# Patient Record
Sex: Female | Born: 1965
Health system: Southern US, Community
[De-identification: ages and names within clinical notes are randomized; demographics above are authoritative.]

## PROBLEM LIST (undated history)

## (undated) DIAGNOSIS — T7840XA Allergy, unspecified, initial encounter: Secondary | ICD-10-CM

## (undated) DIAGNOSIS — M270 Developmental disorders of jaws: Secondary | ICD-10-CM

## (undated) DIAGNOSIS — E785 Hyperlipidemia, unspecified: Secondary | ICD-10-CM

## (undated) DIAGNOSIS — B019 Varicella without complication: Secondary | ICD-10-CM

## (undated) DIAGNOSIS — R569 Unspecified convulsions: Secondary | ICD-10-CM

## (undated) DIAGNOSIS — H409 Unspecified glaucoma: Secondary | ICD-10-CM

## (undated) DIAGNOSIS — M1990 Unspecified osteoarthritis, unspecified site: Secondary | ICD-10-CM

## (undated) DIAGNOSIS — C801 Malignant (primary) neoplasm, unspecified: Secondary | ICD-10-CM

## (undated) HISTORY — DX: Developmental disorders of jaws: M27.0

## (undated) HISTORY — PX: REDUCTION MAMMAPLASTY: SUR839

## (undated) HISTORY — DX: Allergy, unspecified, initial encounter: T78.40XA

## (undated) HISTORY — DX: Varicella without complication: B01.9

## (undated) HISTORY — PX: CATARACT EXTRACTION: SUR2

## (undated) HISTORY — DX: Unspecified glaucoma: H40.9

## (undated) HISTORY — DX: Unspecified convulsions: R56.9

## (undated) HISTORY — DX: Unspecified osteoarthritis, unspecified site: M19.90

## (undated) HISTORY — DX: Hyperlipidemia, unspecified: E78.5

---

## 1989-11-10 HISTORY — PX: TUBAL LIGATION: SHX77

## 1994-11-10 HISTORY — PX: CARPAL TUNNEL RELEASE: SHX101

## 2000-11-10 HISTORY — PX: ABDOMINAL HYSTERECTOMY: SHX81

## 2007-10-04 ENCOUNTER — Ambulatory Visit: Payer: Self-pay | Admitting: Gynecology

## 2007-12-07 ENCOUNTER — Ambulatory Visit: Payer: Self-pay

## 2008-12-13 ENCOUNTER — Ambulatory Visit: Payer: Self-pay

## 2009-11-10 DIAGNOSIS — C801 Malignant (primary) neoplasm, unspecified: Secondary | ICD-10-CM

## 2009-11-10 HISTORY — DX: Malignant (primary) neoplasm, unspecified: C80.1

## 2009-12-18 ENCOUNTER — Ambulatory Visit: Payer: Self-pay

## 2010-12-24 ENCOUNTER — Ambulatory Visit: Payer: Self-pay

## 2011-03-25 NOTE — Assessment & Plan Note (Signed)
NAMEASIAH, BROWDER              ACCOUNT NO.:  [id number]   MEDICAL RECORD NO.:  [id number]          PATIENT TYPE:  POB   LOCATION:  [facility] at [facility]         FACILITY:  [facility]   PHYSICIAN:  [name], MD        DATE OF BIRTH:  29-Jul-1966   DATE OF SERVICE:  10/04/2007                                  CLINIC NOTE   HISTORY OF PRESENT ILLNESS:  Marcelino Duster is a 45 year old, married white  gravida 2, para 2 who comes in for her annual exam.  Her complaint this  year is that she has urinary frequency.  She does not have stress  incontinence.  She reports that she is having some insomnia issues, and  still some anxiety.  Of note, her mother died of pancreatic cancer last  year.  Her father has health issues.   MEDICATIONS:  1. Tylenol PM.  2. Tylenol as necessary.  3. Vitamin B12 p.o.  4. Vitamin E p.o.  5. Ginseng p.o.   PAST MEDICAL HISTORY:  1. History of gestational diabetes with both pregnancies.  2. Obesity.  3. Urinary frequency.  4. Arthritis of her hips.  5. Glaucoma.   REVIEW OF SYSTEMS:  She has been married for 7 years.  She denies  dyspareunia.  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   PAST SURGICAL HISTORY:  1. Vaginal hysterectomy.  2. Tubal ligation.  3. Right salpingo-oophorectomy.   ALLERGIES:  PENICILLIN.   FAMILY HISTORY:  Positive for pancreatic cancer in her mother.  Diabetes  in other family members.  She denies a family history of breast, GYN,  colon malignancies no latex allergy.   SOCIAL HISTORY:  She smokes.  She is working on quitting.  She is down  to half a pack a day.  She has been smoking for 20 years.  She reports  rare social alcohol.   PHYSICAL EXAMINATION:  VITAL SIGNS:  Weight 132 pounds, height 5 feet,  blood pressure 122/80.  HEENT:  Normal.  HEART:  Regular rate and rhythm.  LUNGS:  Clear to auscultation bilaterally.  BREAST:  Normal bilaterally.   ABDOMEN:  No masses, no hepatosplenomegaly.  No pain.  EXTERNAL  GENITALIA:  She has two skin areas of the right aspect of the  labia minora.  She says these have been there for years.  One appears to  be similar to a cholesterol deposit.  The other one is rather pimple-  like.  There are no other abnormalities.  Vaginal cuff appears normal.  Bimanual exam is negative.   ASSESSMENT/PLAN:  Annual exam.  Recommended she continue her self-breast  exam and start self vulvar exam monthly.  Due to her anxiety, I will  check a thyroid, and she will follow-up in several weeks to discuss  medical treatment for that.  With regard to her moderate obesity and her  history of gestational diabetes twice, will check a random glucose, and  general weight loss is advised.  She is already working on quitting  smoking.      [name], MD     MCD/MEDQ  D:  10/04/2007  T:  10/05/2007  Job:  [id number] 

## 2012-01-08 ENCOUNTER — Ambulatory Visit: Payer: Self-pay

## 2013-01-25 ENCOUNTER — Ambulatory Visit: Payer: Self-pay

## 2014-02-07 ENCOUNTER — Ambulatory Visit: Payer: Self-pay

## 2015-02-14 ENCOUNTER — Ambulatory Visit: Admit: 2015-02-14 | Disposition: A | Discharge status: Home / Self Care | Payer: Self-pay

## 2015-08-23 ENCOUNTER — Ambulatory Visit (INDEPENDENT_AMBULATORY_CARE_PROVIDER_SITE_OTHER): Payer: Managed Care, Other (non HMO) | Admitting: Primary Care

## 2015-08-23 ENCOUNTER — Encounter (INDEPENDENT_AMBULATORY_CARE_PROVIDER_SITE_OTHER): Payer: Self-pay

## 2015-08-23 ENCOUNTER — Encounter: Payer: Self-pay | Admitting: Primary Care

## 2015-08-23 VITALS — BP 122/84 | HR 68 | Temp 98.1°F | Ht 61.0 in | Wt 140.8 lb

## 2015-08-23 DIAGNOSIS — M1990 Unspecified osteoarthritis, unspecified site: Secondary | ICD-10-CM | POA: Diagnosis not present

## 2015-08-23 DIAGNOSIS — R030 Elevated blood-pressure reading, without diagnosis of hypertension: Secondary | ICD-10-CM

## 2015-08-23 DIAGNOSIS — I10 Essential (primary) hypertension: Secondary | ICD-10-CM | POA: Insufficient documentation

## 2015-08-23 DIAGNOSIS — H409 Unspecified glaucoma: Secondary | ICD-10-CM | POA: Diagnosis not present

## 2015-08-23 NOTE — Assessment & Plan Note (Signed)
Initially 142/84, 122/84 upon recheck. No prior history of elevated readings per patient. Will continue to monitor.

## 2015-08-23 NOTE — Assessment & Plan Note (Signed)
Diagnosed 10 years ago. Currently managed by glaucoma specialist semi-annually.
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
## 2015-08-23 NOTE — Assessment & Plan Note (Signed)
Present to hips and hands. Managed on 50 mg of Tramadol daily from prior PCP with relief. She typically takes 1/2 tablet. Ruled out RA at [facility].

## 2015-08-23 NOTE — Progress Notes (Signed)
Pre visit review using our clinic review tool, if applicable. No additional management support is needed unless otherwise documented below in the visit note. 

## 2015-08-23 NOTE — Patient Instructions (Signed)
Please schedule a physical with me in the next 3 months. You will also schedule a lab only appointment one week prior. We will discuss your lab results during your physical.  It was a pleasure to meet you today! Please don't hesitate to call me with any questions. Welcome to [place]!   

## 2015-09-06 ENCOUNTER — Other Ambulatory Visit: Payer: Self-pay | Admitting: *Deleted

## 2015-09-06 DIAGNOSIS — M1990 Unspecified osteoarthritis, unspecified site: Secondary | ICD-10-CM

## 2015-09-06 DIAGNOSIS — G4700 Insomnia, unspecified: Secondary | ICD-10-CM

## 2015-09-06 MED ORDER — TRAMADOL HCL 50 MG PO TABS: 25.0000 mg | ORAL_TABLET | Freq: Every day | ORAL | Status: DC | PRN

## 2015-09-06 MED ORDER — ZOLPIDEM TARTRATE 10 MG PO TABS: ORAL_TABLET | ORAL | Status: DC

## 2015-09-06 NOTE — Telephone Encounter (Signed)
Pt left voicemail at Triage. When pt established care with [name], NP pt forgot to request Rxs to be refilled, pt request meds refilled and then request call back once it's done  [facility]

## 2015-09-10 ENCOUNTER — Encounter: Payer: Self-pay | Admitting: Primary Care

## 2015-09-12 NOTE — Telephone Encounter (Signed)
Rx were called in on 09/06/2015. Not sure what happen. [facility] and spoken to [facility]ist. [facility]

## 2015-11-01 ENCOUNTER — Other Ambulatory Visit: Payer: Self-pay | Admitting: Primary Care

## 2015-11-01 NOTE — Telephone Encounter (Signed)
Electronically refill request for   tramadol (ULTRAM) 50 MG tablet   Take 0.5-1 tablets (25-50 mg total) by mouth daily as needed for severe pain.  Dispense: 30 tablet   Refills: 0     Last prescribed on 09/06/2015. Last seen on 08/23/2015. CPE on 11/29/2015.

## 2015-11-02 NOTE — Telephone Encounter (Signed)
[facility] 

## 2015-11-07 ENCOUNTER — Other Ambulatory Visit: Payer: Self-pay | Admitting: Primary Care

## 2015-11-08 NOTE — Telephone Encounter (Signed)
Rx called in as directed.   

## 2015-11-08 NOTE — Telephone Encounter (Signed)
Ok to refill 

## 2015-11-13 ENCOUNTER — Other Ambulatory Visit: Payer: Self-pay | Admitting: Primary Care

## 2015-11-13 DIAGNOSIS — Z0000 Encounter for general adult medical examination without abnormal findings: Secondary | ICD-10-CM

## 2015-11-22 ENCOUNTER — Other Ambulatory Visit: Payer: Managed Care, Other (non HMO)

## 2015-11-26 ENCOUNTER — Other Ambulatory Visit: Payer: Managed Care, Other (non HMO)

## 2015-11-29 ENCOUNTER — Encounter: Payer: Managed Care, Other (non HMO) | Admitting: Primary Care

## 2015-12-06 ENCOUNTER — Other Ambulatory Visit (INDEPENDENT_AMBULATORY_CARE_PROVIDER_SITE_OTHER): Payer: Managed Care, Other (non HMO)

## 2015-12-06 DIAGNOSIS — Z0000 Encounter for general adult medical examination without abnormal findings: Secondary | ICD-10-CM

## 2015-12-06 LAB — LIPID PANEL
CHOL/HDL RATIO: 4
Cholesterol: 154 mg/dL (ref 0–200)
HDL: 39 mg/dL — AB (ref >39.00)
LDL CALC: 85 mg/dL (ref 0–99)
NonHDL: 115.04
TRIGLYCERIDES: 148 mg/dL (ref 0.0–149.0)
VLDL: 29.6 mg/dL (ref 0.0–40.0)

## 2015-12-06 LAB — COMPREHENSIVE METABOLIC PANEL
ALT: 13 U/L (ref 0–35)
AST: 16 U/L (ref 0–37)
Albumin: 4.5 g/dL (ref 3.5–5.2)
Alkaline Phosphatase: 72 U/L (ref 39–117)
BUN: 21 mg/dL (ref 6–23)
CO2: 26 meq/L (ref 19–32)
Calcium: 9.2 mg/dL (ref 8.4–10.5)
Chloride: 104 mEq/L (ref 96–112)
Creatinine, Ser: 0.73 mg/dL (ref 0.40–1.20)
GFR: 90 mL/min (ref >60.00)
GLUCOSE: 95 mg/dL (ref 70–99)
POTASSIUM: 4.1 meq/L (ref 3.5–5.1)
Sodium: 137 mEq/L (ref 135–145)
Total Bilirubin: 0.4 mg/dL (ref 0.2–1.2)
Total Protein: 6.9 g/dL (ref 6.0–8.3)

## 2015-12-06 LAB — CBC
HEMATOCRIT: 41.8 % (ref 36.0–46.0)
HEMOGLOBIN: 14 g/dL (ref 12.0–15.0)
MCHC: 33.6 g/dL (ref 30.0–36.0)
MCV: 93.8 fl (ref 78.0–100.0)
Platelets: 403 10*3/uL — ABNORMAL HIGH (ref 150.0–400.0)
RBC: 4.46 Mil/uL (ref 3.87–5.11)
RDW: 13.2 % (ref 11.5–15.5)
WBC: 8.7 10*3/uL (ref 4.0–10.5)

## 2015-12-06 LAB — HEMOGLOBIN A1C: Hgb A1c MFr Bld: 6 % (ref 4.6–6.5)

## 2015-12-06 LAB — TSH: TSH: 0.7 u[IU]/mL (ref 0.35–4.50)

## 2015-12-06 LAB — VITAMIN D 25 HYDROXY (VIT D DEFICIENCY, FRACTURES): VITD: 26.81 ng/mL — ABNORMAL LOW (ref 30.00–100.00)

## 2015-12-13 ENCOUNTER — Encounter: Payer: Self-pay | Admitting: Radiology

## 2015-12-13 ENCOUNTER — Ambulatory Visit (INDEPENDENT_AMBULATORY_CARE_PROVIDER_SITE_OTHER): Payer: Managed Care, Other (non HMO) | Admitting: Primary Care

## 2015-12-13 ENCOUNTER — Encounter: Payer: Self-pay | Admitting: Primary Care

## 2015-12-13 VITALS — BP 132/84 | HR 76 | Temp 98.1°F | Ht 61.0 in | Wt 141.8 lb

## 2015-12-13 DIAGNOSIS — Z1239 Encounter for other screening for malignant neoplasm of breast: Secondary | ICD-10-CM | POA: Diagnosis not present

## 2015-12-13 DIAGNOSIS — Z0000 Encounter for general adult medical examination without abnormal findings: Secondary | ICD-10-CM | POA: Diagnosis not present

## 2015-12-13 DIAGNOSIS — E119 Type 2 diabetes mellitus without complications: Secondary | ICD-10-CM | POA: Insufficient documentation

## 2015-12-13 DIAGNOSIS — R7303 Prediabetes: Secondary | ICD-10-CM | POA: Diagnosis not present

## 2015-12-13 DIAGNOSIS — E559 Vitamin D deficiency, unspecified: Secondary | ICD-10-CM

## 2015-12-13 DIAGNOSIS — E1165 Type 2 diabetes mellitus with hyperglycemia: Secondary | ICD-10-CM | POA: Insufficient documentation

## 2015-12-13 DIAGNOSIS — Z0001 Encounter for general adult medical examination with abnormal findings: Secondary | ICD-10-CM | POA: Insufficient documentation

## 2015-12-13 DIAGNOSIS — M1990 Unspecified osteoarthritis, unspecified site: Secondary | ICD-10-CM

## 2015-12-13 DIAGNOSIS — R030 Elevated blood-pressure reading, without diagnosis of hypertension: Secondary | ICD-10-CM

## 2015-12-13 NOTE — Patient Instructions (Signed)
I've ordered your mammogram.  Continue your efforts towards a healthy lifestyle through healthy diet and exercise.  Reduce consumption of processed carbohydrates in the form of breads, rice, pastas.  Increase consumption of water. Try to consume 8 bottles of water daily.  You should be getting 1 hour of moderate intensity exercise 5 days weekly.  Start a vitamin D supplement. Take 2000 units of vitamin D daily. We will recheck your level in 3 months.  You are borderline diabetic. Continue to work on your diet and exercise for prevention. We will recheck these levels in 3 months.  Schedule a lab only appointment in 3 months.  Follow up in 1 year for repeat physical or sooner if needed.  It was a pleasure to see you today!

## 2015-12-13 NOTE — Assessment & Plan Note (Signed)
Level of 26. Will have her start 2000 units OTC daily. Will repeat levels in 3 months.

## 2015-12-13 NOTE — Assessment & Plan Note (Signed)
Stable today.  Will continue to monitor.

## 2015-12-13 NOTE — Progress Notes (Signed)
Subjective:    Patient ID: Monica Daniel, female    DOB: 1966-08-22, 50 y.o.   MRN: [id number]  HPI  Ms. Machado is a 50 year old female who presents today for complete physical.  Immunizations: -Tetanus: Completed within 10 years. -Influenza: Completed in October 2016   Diet: She endorses a healthy diet.  Breakfast: Cherrios, bagel Lunch: Sandwich (home made), apple, oatmeal cookie Dinner: Salad, meat, vegetable, whole wheat breads, pasta Snacks: cherrios, fiber bars Desserts: Occcasionally Beverages: Diet mountain dew, 1/2 sweet/unsweet tea, coffee, water ( 3 glasses daily)  Exercise: She has been exercising daily for the past several weeks. She will walk on the treadmill.  Eye exam: Completed late 2016 Dental exam: Completed in January 2017 Mammogram: Due in Spring 2017 Pap: Hysterectomy    Review of Systems  HENT: Negative for rhinorrhea.   Respiratory: Negative for cough and shortness of breath.   Cardiovascular: Negative for chest pain.  Gastrointestinal: Negative for diarrhea and constipation.  Genitourinary: Negative for difficulty urinating.  Musculoskeletal: Positive for arthralgias.       Taking 1/2 tramadol daily  Skin: Negative for rash.  Allergic/Immunologic: Positive for environmental allergies.  Neurological: Negative for dizziness, numbness and headaches.  Psychiatric/Behavioral:       Increased anxiety with husbands heart attack.        Past Medical History  Diagnosis Date  . Arthritis   . Chickenpox   . Allergy   . Seizures ([place])   . Glaucoma     Social History   Social History  . Marital Status: Married    Spouse Name: N/A  . Number of Children: N/A  . Years of Education: N/A   Occupational History  . Not on file.   Social History Main Topics  . Smoking status: Current Every Day Smoker -- 0.50 packs/day    Types: Cigarettes  . Smokeless tobacco: Not on file  . Alcohol Use: 0.0 oz/week    0 Standard drinks or equivalent per  week     Comment: rarely  . Drug Use: Not on file  . Sexual Activity: Not on file   Other Topics Concern  . Not on file   Social History Narrative   Married.   2 children.   Works doing Radiographer, therapeutic.   Enjoys going to [employer], fishing, gardening.    Past Surgical History  Procedure Laterality Date  . Abdominal hysterectomy  2002    Family History  Problem Relation Age of Onset  . Arthritis Father   . Lung cancer Father   . Hypertension Sister   . Pancreatic cancer Mother   . Diabetes Mother   . Diabetes Maternal Aunt     Allergies  Allergen Reactions  . Lac Bovis Nausea And Vomiting  . Penicillin G Rash    Current Outpatient Prescriptions on File Prior to Visit  Medication Sig Dispense Refill  . traMADol (ULTRAM) 50 MG tablet TAKE 1/2 TO 1 TABLET BY MOUTH EVERY DAY AS NEEDED FOR SEVERE PAIN 30 tablet 0  . zolpidem (AMBIEN) 10 MG tablet TAKE 1 TABLET BY MOUTH AT BEDTIME AS NEEDED FOR SLEEP 30 tablet 3   No current facility-administered medications on file prior to visit.    BP 132/84 mmHg  Pulse 76  Temp(Src) 98.1 F (36.7 C) (Oral)  Ht 5\' 1"  (1.549 m)  Wt 141 lb 12.8 oz (64.32 kg)  BMI 26.81 kg/m2  SpO2 98%    Objective:   Physical Exam  Constitutional:  She is oriented to person, place, and time. She appears well-nourished.  HENT:  Right Ear: Tympanic membrane and ear canal normal.  Left Ear: Tympanic membrane and ear canal normal.  Nose: Nose normal.  Mouth/Throat: Oropharynx is clear and moist.  Eyes: Conjunctivae and EOM are normal. Pupils are equal, round, and reactive to light.  Neck: Neck supple. No thyromegaly present.  Cardiovascular: Normal rate and regular rhythm.   No murmur heard. Pulmonary/Chest: Effort normal and breath sounds normal. She has no rales.  Abdominal: Soft. Bowel sounds are normal. There is no tenderness.  Musculoskeletal: Normal range of motion.  Lymphadenopathy:    She has no cervical adenopathy.    Neurological: She is alert and oriented to person, place, and time. She has normal reflexes. No cranial nerve deficit.  Skin: Skin is warm and dry. No rash noted.  Psychiatric: She has a normal mood and affect.          Assessment & Plan:

## 2015-12-13 NOTE — Assessment & Plan Note (Addendum)
Worse recently due to handing more outdoor house work activities as her husband was recently in the hospital. Managed on tramadol PRN. UDS due today.

## 2015-12-13 NOTE — Progress Notes (Signed)
Pre visit review using our clinic review tool, if applicable. No additional management support is needed unless otherwise documented below in the visit note. 

## 2015-12-13 NOTE — Assessment & Plan Note (Signed)
A1C of 6.0 on recent labs. Strong FH of diabetes. She has recently been working to improve her diet and start exercising. Education provided regarding diet and exercise. Will repeat labs in 3 months.

## 2015-12-13 NOTE — Assessment & Plan Note (Signed)
Tdap and Flu UTD. Ordered screening mammogram. Discussed the importance of a healthy diet and regular exercise in order for weight loss and to reduce risk of other medical diseases. Exam unremarkable. Labs with vitamin D def and borderline diabetes. Will repeat labs in 3 months. Follow up in 1 year for repeat physical.

## 2015-12-21 ENCOUNTER — Other Ambulatory Visit: Payer: Self-pay | Admitting: Primary Care

## 2015-12-21 NOTE — Telephone Encounter (Signed)
Sent. [facility]

## 2015-12-21 NOTE — Telephone Encounter (Signed)
Electronically refill request for   tramadol (ULTRAM) 50 MG tablet   TAKE 1/2 TO 1 TABLET BY MOUTH EVERY DAY AS NEEDED FOR SEVERE PAIN  Dispense: 30 tablet   Refills: 0     Last prescribed on 11/01/2015. Last seen on 12/13/2015. Lab appt on 03/13/2016.

## 2016-01-04 ENCOUNTER — Encounter: Payer: Self-pay | Admitting: Family Medicine

## 2016-01-20 ENCOUNTER — Other Ambulatory Visit: Payer: Self-pay | Admitting: Primary Care

## 2016-01-20 DIAGNOSIS — M1990 Unspecified osteoarthritis, unspecified site: Secondary | ICD-10-CM

## 2016-01-21 NOTE — Telephone Encounter (Signed)
Electronically refill request for   traMADol (ULTRAM) 50 MG tablet   TAKE 1/2 TO 1 TABLET BY MOUTH ONCE A DAY AS NEEDED FOR SEVERE PAIN  Dispense: 30 tablet   Refills: 0     Last prescribed on 12/21/2015. Last seen for CPE on 12/13/2015. Lab appt on 03/13/2016.

## 2016-01-22 NOTE — Telephone Encounter (Signed)
[facility] 

## 2016-01-29 ENCOUNTER — Encounter: Payer: Self-pay | Admitting: Primary Care

## 2016-03-05 ENCOUNTER — Other Ambulatory Visit: Payer: Self-pay | Admitting: Primary Care

## 2016-03-05 NOTE — Telephone Encounter (Signed)
Pt requesting a refill Tramadol HCL 50MG  TAKE 1/2 TO 1 TAB ONCE A DAY AS NEEDED FOR PAIN, last filled on 01/21/16 last OV 12/13/2015, 3 month lab appt scheduled as recommended.

## 2016-03-05 NOTE — Telephone Encounter (Signed)
Called and spoken patient. Notified patient that this is longer than a 3 month's supply. Patient verbalized understanding.

## 2016-03-05 NOTE — Telephone Encounter (Signed)
[facility] [address]

## 2016-03-06 ENCOUNTER — Other Ambulatory Visit: Payer: Self-pay | Admitting: Primary Care

## 2016-03-06 DIAGNOSIS — R7303 Prediabetes: Secondary | ICD-10-CM

## 2016-03-06 DIAGNOSIS — E559 Vitamin D deficiency, unspecified: Secondary | ICD-10-CM

## 2016-03-13 ENCOUNTER — Other Ambulatory Visit (INDEPENDENT_AMBULATORY_CARE_PROVIDER_SITE_OTHER): Payer: Managed Care, Other (non HMO)

## 2016-03-13 DIAGNOSIS — E559 Vitamin D deficiency, unspecified: Secondary | ICD-10-CM | POA: Diagnosis not present

## 2016-03-13 DIAGNOSIS — R7303 Prediabetes: Secondary | ICD-10-CM | POA: Diagnosis not present

## 2016-03-13 LAB — HEMOGLOBIN A1C: Hgb A1c MFr Bld: 6 % (ref 4.6–6.5)

## 2016-03-13 LAB — VITAMIN D 25 HYDROXY (VIT D DEFICIENCY, FRACTURES): VITD: 44.26 ng/mL (ref 30.00–100.00)

## 2016-03-24 ENCOUNTER — Ambulatory Visit
Admission: RE | Admit: 2016-03-24 | Discharge: 2016-03-24 | Disposition: A | Discharge status: Home / Self Care | Payer: Managed Care, Other (non HMO) | Source: Ambulatory Visit | Attending: Primary Care | Admitting: Primary Care

## 2016-03-24 DIAGNOSIS — Z1231 Encounter for screening mammogram for malignant neoplasm of breast: Secondary | ICD-10-CM | POA: Diagnosis not present

## 2016-03-24 DIAGNOSIS — Z1239 Encounter for other screening for malignant neoplasm of breast: Secondary | ICD-10-CM

## 2016-04-13 ENCOUNTER — Other Ambulatory Visit: Payer: Self-pay | Admitting: Primary Care

## 2016-04-13 DIAGNOSIS — G4700 Insomnia, unspecified: Secondary | ICD-10-CM

## 2016-04-14 NOTE — Telephone Encounter (Signed)
[facility]

## 2016-04-14 NOTE — Telephone Encounter (Signed)
Electronically refill request for   zolpidem (AMBIEN) 10 MG tablet   TAKE 1 TABLET BY MOUTH AT BEDTIME AS NEEDED FOR SLEEP  Dispense: 30 tablet   Refills: 3     Last prescribed on 11/08/2015. Last seen on 12/13/2015. No future appt.

## 2016-05-30 ENCOUNTER — Encounter: Payer: Self-pay | Admitting: Primary Care

## 2016-05-30 ENCOUNTER — Ambulatory Visit (INDEPENDENT_AMBULATORY_CARE_PROVIDER_SITE_OTHER): Payer: 59 | Admitting: Primary Care

## 2016-05-30 VITALS — BP 122/84 | HR 66 | Temp 98.0°F | Ht 61.0 in | Wt 135.1 lb

## 2016-05-30 DIAGNOSIS — J019 Acute sinusitis, unspecified: Secondary | ICD-10-CM

## 2016-05-30 MED ORDER — AZITHROMYCIN 250 MG PO TABS: ORAL_TABLET | ORAL | Status: DC

## 2016-05-30 NOTE — Patient Instructions (Signed)
Start Azithromycin antibiotics. Take 2 tablets by mouth today, then 1 tablet daily for 4 additional days.  Nasal Congestion: Try using Flonase (fluticasone) nasal spray. Instill 2 sprays in each nostril once daily.   Start Netipot rinses daily as discussed.  Ensure you are staying hydrated with water.  It was a pleasure to see you today!  Sinusitis, Adult Sinusitis is redness, soreness, and inflammation of the paranasal sinuses. Paranasal sinuses are air pockets within the bones of your face. They are located beneath your eyes, in the middle of your forehead, and above your eyes. In healthy paranasal sinuses, mucus is able to drain out, and air is able to circulate through them by way of your nose. However, when your paranasal sinuses are inflamed, mucus and air can become trapped. This can allow bacteria and other germs to grow and cause infection. Sinusitis can develop quickly and last only a short time (acute) or continue over a long period (chronic). Sinusitis that lasts for more than 12 weeks is considered chronic. CAUSES Causes of sinusitis include:  Allergies.  Structural abnormalities, such as displacement of the cartilage that separates your nostrils (deviated septum), which can decrease the air flow through your nose and sinuses and affect sinus drainage.  Functional abnormalities, such as when the small hairs (cilia) that line your sinuses and help remove mucus do not work properly or are not present. SIGNS AND SYMPTOMS Symptoms of acute and chronic sinusitis are the same. The primary symptoms are pain and pressure around the affected sinuses. Other symptoms include:  Upper toothache.  Earache.  Headache.  Bad breath.  Decreased sense of smell and taste.  A cough, which worsens when you are lying flat.  Fatigue.  Fever.  Thick drainage from your nose, which often is green and may contain pus (purulent).  Swelling and warmth over the affected  sinuses. DIAGNOSIS Your health care provider will perform a physical exam. During your exam, your health care provider may perform any of the following to help determine if you have acute sinusitis or chronic sinusitis:  Look in your nose for signs of abnormal growths in your nostrils (nasal polyps).  Tap over the affected sinus to check for signs of infection.  View the inside of your sinuses using an imaging device that has a light attached (endoscope). If your health care provider suspects that you have chronic sinusitis, one or more of the following tests may be recommended:  Allergy tests.  Nasal culture. A sample of mucus is taken from your nose, sent to a lab, and screened for bacteria.  Nasal cytology. A sample of mucus is taken from your nose and examined by your health care provider to determine if your sinusitis is related to an allergy. TREATMENT Most cases of acute sinusitis are related to a viral infection and will resolve on their own within 10 days. Sometimes, medicines are prescribed to help relieve symptoms of both acute and chronic sinusitis. These may include pain medicines, decongestants, nasal steroid sprays, or saline sprays. However, for sinusitis related to a bacterial infection, your health care provider will prescribe antibiotic medicines. These are medicines that will help kill the bacteria causing the infection. Rarely, sinusitis is caused by a fungal infection. In these cases, your health care provider will prescribe antifungal medicine. For some cases of chronic sinusitis, surgery is needed. Generally, these are cases in which sinusitis recurs more than 3 times per year, despite other treatments. HOME CARE INSTRUCTIONS  Drink plenty of water. Water  helps thin the mucus so your sinuses can drain more easily.  Use a humidifier.  Inhale steam 3-4 times a day (for example, sit in the bathroom with the shower running).  Apply a warm, moist washcloth to your face  3-4 times a day, or as directed by your health care provider.  Use saline nasal sprays to help moisten and clean your sinuses.  Take medicines only as directed by your health care provider.  If you were prescribed either an antibiotic or antifungal medicine, finish it all even if you start to feel better. SEEK IMMEDIATE MEDICAL CARE IF:  You have increasing pain or severe headaches.  You have nausea, vomiting, or drowsiness.  You have swelling around your face.  You have vision problems.  You have a stiff neck.  You have difficulty breathing.   This information is not intended to replace advice given to you by your health care provider. Make sure you discuss any questions you have with your health care provider.   Document Released: 10/27/2005 Document Revised: 11/17/2014 Document Reviewed: 11/11/2011 Elsevier Interactive Patient Education [employer].

## 2016-05-30 NOTE — Progress Notes (Signed)
Subjective:    Patient ID: Monica Daniel, female    DOB: [birth year], 50 y.o.   MRN: [id number]  HPI  [name] is a 50 year old female who presents today with a chief complaint of sinus pressure. She also reports nasal congestion and headaches. Her sinus pressure and congestion has been present for the past 2 weeks with worsening symptoms over the past several days. She's starting to blow out thick green mucous from her nasal cavity and notice increased fatigue. She's been using OTC sinus pills with temporary improvement. Overall she's feeling worse.  Review of Systems  Constitutional: Positive for chills and fatigue. Negative for fever.  HENT: Positive for congestion, postnasal drip and sinus pressure. Negative for sore throat.   Respiratory: Negative for cough and shortness of breath.   Cardiovascular: Negative for chest pain.       Past Medical History  Diagnosis Date  . Arthritis   . Chickenpox   . Allergy   . Seizures ([place])   . Glaucoma      Social History   Social History  . Marital Status: Married    Spouse Name: N/A  . Number of Children: N/A  . Years of Education: N/A   Occupational History  . Not on file.   Social History Main Topics  . Smoking status: Current Every Day Smoker -- 0.50 packs/day    Types: Cigarettes  . Smokeless tobacco: Not on file  . Alcohol Use: 0.0 oz/week    0 Standard drinks or equivalent per week     Comment: rarely  . Drug Use: Not on file  . Sexual Activity: Not on file   Other Topics Concern  . Not on file   Social History Narrative   Married.   2 children.   Works doing Radiographer, therapeutic.   Enjoys going to [employer], fishing, gardening.    Past Surgical History  Procedure Laterality Date  . Abdominal hysterectomy  2002  . Reduction mammaplasty Bilateral 20+yrs ago    Family History  Problem Relation Age of Onset  . Arthritis Father   . Lung cancer Father   . Hypertension Sister   . Pancreatic cancer  Mother   . Diabetes Mother   . Diabetes Maternal Aunt     Allergies  Allergen Reactions  . Lac Bovis Nausea And Vomiting  . Penicillin G Rash    Current Outpatient Prescriptions on File Prior to Visit  Medication Sig Dispense Refill  . zolpidem (AMBIEN) 10 MG tablet TAKE 1 TABLET BY MOUTH AT BEDTIME AS NEEDED FOR SLEEP 90 tablet 1  . traMADol (ULTRAM) 50 MG tablet TAKE 1/2 TO 1 TABLET BY MOUTH ONCE A DAY AS NEEDED FOR SEVERE PAIN (Patient not taking: Reported on 05/30/2016) 90 tablet 0   No current facility-administered medications on file prior to visit.    BP 122/84 mmHg  Pulse 66  Temp(Src) 98 F (36.7 C) (Oral)  Ht 5\' 1"  (1.549 m)  Wt 135 lb 1.9 oz (61.29 kg)  BMI 25.54 kg/m2  SpO2 98%    Objective:   Physical Exam  Constitutional: She appears well-nourished.  HENT:  Right Ear: Tympanic membrane and ear canal normal.  Left Ear: Tympanic membrane and ear canal normal.  Nose: Right sinus exhibits frontal sinus tenderness. Right sinus exhibits no maxillary sinus tenderness. Left sinus exhibits frontal sinus tenderness. Left sinus exhibits no maxillary sinus tenderness.  Mouth/Throat: Oropharynx is clear and moist.  Eyes: Conjunctivae are normal.  Neck: Neck supple.  Cardiovascular: Normal rate and regular rhythm.   Pulmonary/Chest: Effort normal and breath sounds normal. She has no wheezes. She has no rales.  Lymphadenopathy:    She has no cervical adenopathy.  Skin: Skin is warm and dry.          Assessment & Plan:  Acute Sinusitis:  Sinus pressure to frontal and maxillary sinuses with tenderness x 2 weeks. Overall feeling worse over past several days with thick, green mucous from nasal cavity, fatigue. Exam today with clear lungs, and overall unremarkable HENT exam. Given duration and worsening of symptoms will treat for presumed bacterial involvement. Rx for Zpak sent to pharmacy. Discussed use of Flonase and Netipot rinses.  Fluids, rest, follow up  PRN.

## 2016-05-30 NOTE — Progress Notes (Signed)
Pre visit review using our clinic review tool, if applicable. No additional management support is needed unless otherwise documented below in the visit note. 

## 2016-06-29 ENCOUNTER — Other Ambulatory Visit: Payer: Self-pay | Admitting: Primary Care

## 2016-06-29 DIAGNOSIS — M1990 Unspecified osteoarthritis, unspecified site: Secondary | ICD-10-CM

## 2016-06-30 NOTE — Telephone Encounter (Signed)
Ok to refill? Electronically refill request for   traMADol (ULTRAM) 50 MG tablet  Last prescribed on 03/05/2016. Last seen on 05/30/2016. No future appt.

## 2016-07-01 NOTE — Telephone Encounter (Signed)
Called in medication to the pharmacy as instructed. 

## 2016-10-28 ENCOUNTER — Encounter: Payer: Self-pay | Admitting: Radiology

## 2016-10-28 ENCOUNTER — Ambulatory Visit (INDEPENDENT_AMBULATORY_CARE_PROVIDER_SITE_OTHER): Payer: 59 | Admitting: Primary Care

## 2016-10-28 ENCOUNTER — Encounter: Payer: Self-pay | Admitting: Primary Care

## 2016-10-28 DIAGNOSIS — G4700 Insomnia, unspecified: Secondary | ICD-10-CM

## 2016-10-28 DIAGNOSIS — M1990 Unspecified osteoarthritis, unspecified site: Secondary | ICD-10-CM

## 2016-10-28 MED ORDER — TRAMADOL HCL 50 MG PO TABS
ORAL_TABLET | ORAL | 0 refills | Status: DC
Start: 2016-10-28 — End: 2017-02-18

## 2016-10-28 MED ORDER — ZOLPIDEM TARTRATE 10 MG PO TABS: 10.0000 mg | ORAL_TABLET | Freq: Every evening | ORAL | 1 refills | Status: DC | PRN

## 2016-10-28 NOTE — Progress Notes (Signed)
Subjective:    Patient ID: Monica Daniel, female    DOB: 1965-11-25, 50 y.o.   MRN: [id number]  HPI  Monica Daniel is a 50 year old female who presents today with a chief complaint of breast mass. Her mass is located to the right breast at the 9 o'clock position.  She completed a breast exam Wednesday night last week and noticed a pea sized knot to the right side. She has not noticed the knot since Sunday this past weekend. She denies pain, changes in skin texture, nipple discharge, family or personal history of breast cancer. She completed her mammogram in May 2017 which showed evidence of fibrocystic breasts.  Review of Systems  Constitutional: Negative for fatigue and unexpected weight change.  Cardiovascular: Negative for chest pain.  Genitourinary:       Breast mass  Skin: Negative for color change and wound.       Past Medical History:  Diagnosis Date  . Allergy   . Arthritis   . Chickenpox   . Glaucoma   . Seizures [facility])      Social History   Social History  . Marital status: Married    Spouse name: N/A  . Number of children: N/A  . Years of education: N/A   Occupational History  . Not on file.   Social History Main Topics  . Smoking status: Current Every Day Smoker    Packs/day: 0.50    Types: Cigarettes  . Smokeless tobacco: Not on file  . Alcohol use 0.0 oz/week     Comment: rarely  . Drug use: Unknown  . Sexual activity: Not on file   Other Topics Concern  . Not on file   Social History Narrative   Married.   2 children.   Works doing Radiographer, therapeutic.   Enjoys going to [employer], fishing, gardening.    Past Surgical History:  Procedure Laterality Date  . ABDOMINAL HYSTERECTOMY  2002  . REDUCTION MAMMAPLASTY Bilateral 20+yrs ago    Family History  Problem Relation Age of Onset  . Arthritis Father   . Lung cancer Father   . Hypertension Sister   . Pancreatic cancer Mother   . Diabetes Mother   . Diabetes Maternal Aunt      Allergies  Allergen Reactions  . Lac Bovis Nausea And Vomiting  . Penicillin G Rash    No current outpatient prescriptions on file prior to visit.   No current facility-administered medications on file prior to visit.     BP (!) 158/100   Pulse 65   Temp 97.8 F (36.6 C) (Oral)   Ht 5\' 1"  (1.549 m)   Wt 140 lb (63.5 kg)   SpO2 98%   BMI 26.45 kg/m    Objective:   Physical Exam  Constitutional: She appears well-nourished.  Neck: Neck supple.  Cardiovascular: Normal rate and regular rhythm.   Pulmonary/Chest: Effort normal and breath sounds normal. Right breast exhibits no mass, no nipple discharge, no skin change and no tenderness. Left breast exhibits no mass, no nipple discharge, no skin change and no tenderness.    Area of concern to right breast at 9 o'clock position. Mass felt to be dense breast tissue. No obvious cyst or suspicious mass. Left breast with dense breast tissue.  Skin: Skin is warm and dry.          Assessment & Plan:  Breast Mass:  Noticed Wednesday last week to right breast at 9 o'clock position.  Exam today with evidence of dense breast tissue bilatearlly. Area of concern not suspicious for infection, cyst, or suspicious mass. Mammogram normal in May 2017, also revealing evidence of fibrocystic dense tissue. Suspect this to be the case. Will have her continue to monitor and return/call if no improvement or changes occur. Strict return precautions provided.  Sheral Flow, NP

## 2016-10-28 NOTE — Progress Notes (Signed)
Pre visit review using our clinic review tool, if applicable. No additional management support is needed unless otherwise documented below in the visit note. 

## 2016-10-28 NOTE — Patient Instructions (Signed)
Your breast exam did not reveal anything suspicious.   Please notify me if you continue to notice the mass and/or if it grows in size, you notice changes in the texture of your breast.  It was a pleasure to see you today!   Breast Self-Awareness Introduction Breast self-awareness means being familiar with how your breasts look and feel. It involves checking your breasts regularly and reporting any changes to your health care provider. Practicing breast self-awareness is important. A change in your breasts can be a sign of a serious medical problem. Being familiar with how your breasts look and feel allows you to find any problems early, when treatment is more likely to be successful. All women should practice breast self-awareness, including women who have had breast implants. How to do a breast self-exam One way to learn what is normal for your breasts and whether your breasts are changing is to do a breast self-exam. To do a breast self-exam: Look for Changes  1. Remove all the clothing above your waist. 2. Stand in front of a mirror in a room with good lighting. 3. Put your hands on your hips. 4. Push your hands firmly downward. 5. Compare your breasts in the mirror. Look for differences between them (asymmetry), such as:  Differences in shape.  Differences in size.  Puckers, dips, and bumps in one breast and not the other. 6. Look at each breast for changes in your skin, such as:  Redness.  Scaly areas. 7. Look for changes in your nipples, such as:  Discharge.  Bleeding.  Dimpling.  Redness.  A change in position. Feel for Changes  Carefully feel your breasts for lumps and changes. It is best to do this while lying on your back on the floor and again while sitting or standing in the shower or tub with soapy water on your skin. Feel each breast in the following way:  Place the arm on the side of the breast you are examining above your head.  Feel your breast with the  other hand.  Start in the nipple area and make  inch (2 cm) overlapping circles to feel your breast. Use the pads of your three middle fingers to do this. Apply light pressure, then medium pressure, then firm pressure. The light pressure will allow you to feel the tissue closest to the skin. The medium pressure will allow you to feel the tissue that is a little deeper. The firm pressure will allow you to feel the tissue close to the ribs.  Continue the overlapping circles, moving downward over the breast until you feel your ribs below your breast.  Move one finger-width toward the center of the body. Continue to use the  inch (2 cm) overlapping circles to feel your breast as you move slowly up toward your collarbone.  Continue the up and down exam using all three pressures until you reach your armpit. Write Down What You Find  Write down what is normal for each breast and any changes that you find. Keep a written record with breast changes or normal findings for each breast. By writing this information down, you do not need to depend only on memory for size, tenderness, or location. Write down where you are in your menstrual cycle, if you are still menstruating. If you are having trouble noticing differences in your breasts, do not get discouraged. With time you will become more familiar with the variations in your breasts and more comfortable with the exam. How often  should I examine my breasts? Examine your breasts every month. If you are breastfeeding, the best time to examine your breasts is after a feeding or after using a breast pump. If you menstruate, the best time to examine your breasts is 5-7 days after your period is over. During your period, your breasts are lumpier, and it may be more difficult to notice changes. When should I see my health care provider? See your health care provider if you notice:  A change in shape or size of your breasts or nipples.  A change in the skin of  your breast or nipples, such as a reddened or scaly area.  Unusual discharge from your nipples.  A lump or thick area that was not there before.  Pain in your breasts.  Anything that concerns you. This information is not intended to replace advice given to you by your health care provider. Make sure you discuss any questions you have with your health care provider. Document Released: 10/27/2005 Document Revised: 04/03/2016 Document Reviewed: 09/16/2015  2017 Elsevier

## 2016-10-28 NOTE — Assessment & Plan Note (Signed)
Managed on Ambien for years. Refill provided today. UDS updated today.

## 2016-10-28 NOTE — Assessment & Plan Note (Signed)
Refill of Tramadol provided. UDS updated.

## 2016-10-29 ENCOUNTER — Encounter: Payer: Self-pay | Admitting: Primary Care

## 2016-12-22 ENCOUNTER — Encounter: Payer: Self-pay | Admitting: Family Medicine

## 2016-12-22 ENCOUNTER — Ambulatory Visit (INDEPENDENT_AMBULATORY_CARE_PROVIDER_SITE_OTHER): Payer: 59 | Admitting: Family Medicine

## 2016-12-22 DIAGNOSIS — B9789 Other viral agents as the cause of diseases classified elsewhere: Secondary | ICD-10-CM | POA: Diagnosis not present

## 2016-12-22 DIAGNOSIS — J069 Acute upper respiratory infection, unspecified: Secondary | ICD-10-CM | POA: Diagnosis not present

## 2016-12-22 NOTE — Progress Notes (Signed)
Pre visit review using our clinic review tool, if applicable. No additional management support is needed unless otherwise documented below in the visit note. 

## 2016-12-22 NOTE — Patient Instructions (Addendum)
Nice to meet you. Your symptoms are likely related to a virus.  You should start on Claritin or Zyrtec. You may also use Tylenol for any discomfort. You can use nasal saline rinses as well. If your symptoms are not improved by Friday please come back to see [place]. If you develop fevers, cough productive of blood, or any new or changing symptoms please seek medical attention immediately.

## 2016-12-22 NOTE — Assessment & Plan Note (Signed)
Symptoms most consistent with viral upper respiratory infection. Patient does not have any focal findings to indicate bacterial illness. Discussed supportive care. Continue Flonase. Can use Claritin or Zyrtec. Advised against nasal decongestants. Tylenol for any discomfort. Of note the patient reports she does not have glaucoma. If symptoms are not improving by Friday she will follow-up in the office for repeat evaluation.

## 2016-12-22 NOTE — Progress Notes (Signed)
  Tommi Rumps, MD Phone: [phone]  Monica Daniel is a 51 y.o. female who presents today for same-day visit.  Patient notes left ear congestion and left sinus congestion starting on Friday. Has developed frontal sinus congestion and right ear congestion as well. Blowing discolored bloody mucus out of her nose. No fevers. Minimal postnasal drip with minimal cough. States she does not feel terrible. Does have sick contacts at work. Uses Flonase regularly. Has been using a nasal decongestant as well.  ROS see history of present illness  Objective  Physical Exam Vitals:   12/22/16 1316  BP: (!) 142/90  Pulse: 79  Temp: 98.7 F (37.1 C)    BP Readings from Last 3 Encounters:  12/22/16 (!) 142/90  10/28/16 (!) 158/100  05/30/16 122/84   Wt Readings from Last 3 Encounters:  12/22/16 138 lb 6.4 oz (62.8 kg)  10/28/16 140 lb (63.5 kg)  05/30/16 135 lb 1.9 oz (61.3 kg)    Physical Exam  Constitutional: She is well-developed, well-nourished, and in no distress.  HENT:  Head: Normocephalic and atraumatic.  Mouth/Throat: Oropharynx is clear and moist. No oropharyngeal exudate.  Normal TMs and ear canals bilaterally  Eyes: Conjunctivae are normal. Pupils are equal, round, and reactive to light.  Cardiovascular: Normal rate, regular rhythm and normal heart sounds.   Pulmonary/Chest: Effort normal and breath sounds normal.     Assessment/Plan: Please see individual problem list.  Viral URI Symptoms most consistent with viral upper respiratory infection. Patient does not have any focal findings to indicate bacterial illness. Discussed supportive care. Continue Flonase. Can use Claritin or Zyrtec. Advised against nasal decongestants. Tylenol for any discomfort. Of note the patient reports she does not have glaucoma. If symptoms are not improving by Friday she will follow-up in the office for repeat evaluation.   Tommi Rumps, MD [place]

## 2017-01-23 ENCOUNTER — Other Ambulatory Visit: Payer: Self-pay | Admitting: Primary Care

## 2017-01-23 ENCOUNTER — Other Ambulatory Visit (INDEPENDENT_AMBULATORY_CARE_PROVIDER_SITE_OTHER): Payer: 59

## 2017-01-23 DIAGNOSIS — E559 Vitamin D deficiency, unspecified: Secondary | ICD-10-CM | POA: Diagnosis not present

## 2017-01-23 DIAGNOSIS — R7303 Prediabetes: Secondary | ICD-10-CM

## 2017-01-23 LAB — LIPID PANEL
CHOLESTEROL: 182 mg/dL (ref 0–200)
HDL: 44 mg/dL (ref >39.00)
LDL CALC: 115 mg/dL — AB (ref 0–99)
NonHDL: 137.76
TRIGLYCERIDES: 116 mg/dL (ref 0.0–149.0)
Total CHOL/HDL Ratio: 4
VLDL: 23.2 mg/dL (ref 0.0–40.0)

## 2017-01-23 LAB — COMPREHENSIVE METABOLIC PANEL
ALT: 14 U/L (ref 0–35)
AST: 15 U/L (ref 0–37)
Albumin: 4.7 g/dL (ref 3.5–5.2)
Alkaline Phosphatase: 81 U/L (ref 39–117)
BUN: 23 mg/dL (ref 6–23)
CO2: 27 meq/L (ref 19–32)
Calcium: 10.3 mg/dL (ref 8.4–10.5)
Chloride: 104 mEq/L (ref 96–112)
Creatinine, Ser: 0.77 mg/dL (ref 0.40–1.20)
GFR: 84.23 mL/min (ref >60.00)
GLUCOSE: 109 mg/dL — AB (ref 70–99)
POTASSIUM: 4.4 meq/L (ref 3.5–5.1)
Sodium: 136 mEq/L (ref 135–145)
Total Bilirubin: 0.4 mg/dL (ref 0.2–1.2)
Total Protein: 7.4 g/dL (ref 6.0–8.3)

## 2017-01-23 LAB — VITAMIN D 25 HYDROXY (VIT D DEFICIENCY, FRACTURES): VITD: 33.06 ng/mL (ref 30.00–100.00)

## 2017-01-23 LAB — HEMOGLOBIN A1C: Hgb A1c MFr Bld: 6.1 % (ref 4.6–6.5)

## 2017-01-23 NOTE — Addendum Note (Signed)
Addended by: Ellamae Sia on: 01/23/2017 01:51 PM   Modules accepted: Orders

## 2017-01-27 ENCOUNTER — Encounter: Payer: Self-pay | Admitting: Primary Care

## 2017-01-27 ENCOUNTER — Ambulatory Visit (INDEPENDENT_AMBULATORY_CARE_PROVIDER_SITE_OTHER): Payer: 59 | Admitting: Primary Care

## 2017-01-27 VITALS — BP 138/86 | HR 67 | Temp 97.9°F | Ht 61.0 in | Wt 137.0 lb

## 2017-01-27 DIAGNOSIS — Z1231 Encounter for screening mammogram for malignant neoplasm of breast: Secondary | ICD-10-CM | POA: Diagnosis not present

## 2017-01-27 DIAGNOSIS — E559 Vitamin D deficiency, unspecified: Secondary | ICD-10-CM

## 2017-01-27 DIAGNOSIS — R030 Elevated blood-pressure reading, without diagnosis of hypertension: Secondary | ICD-10-CM

## 2017-01-27 DIAGNOSIS — Z1211 Encounter for screening for malignant neoplasm of colon: Secondary | ICD-10-CM

## 2017-01-27 DIAGNOSIS — R7303 Prediabetes: Secondary | ICD-10-CM

## 2017-01-27 DIAGNOSIS — Z1239 Encounter for other screening for malignant neoplasm of breast: Secondary | ICD-10-CM

## 2017-01-27 DIAGNOSIS — M1990 Unspecified osteoarthritis, unspecified site: Secondary | ICD-10-CM | POA: Diagnosis not present

## 2017-01-27 DIAGNOSIS — Z23 Encounter for immunization: Secondary | ICD-10-CM | POA: Diagnosis not present

## 2017-01-27 DIAGNOSIS — G4700 Insomnia, unspecified: Secondary | ICD-10-CM

## 2017-01-27 DIAGNOSIS — Z0000 Encounter for general adult medical examination without abnormal findings: Secondary | ICD-10-CM

## 2017-01-27 DIAGNOSIS — Z720 Tobacco use: Secondary | ICD-10-CM

## 2017-01-27 MED ORDER — BUPROPION HCL ER (SR) 150 MG PO TB12: 150.0000 mg | ORAL_TABLET | Freq: Two times a day (BID) | ORAL | 0 refills | Status: DC

## 2017-01-27 NOTE — Assessment & Plan Note (Signed)
Overall improved on Ambien.

## 2017-01-27 NOTE — Assessment & Plan Note (Signed)
Stable today, continue to monitor.

## 2017-01-27 NOTE — Addendum Note (Signed)
Addended by: Jacqualin Combes on: 01/27/2017 12:38 PM   Modules accepted: Orders

## 2017-01-27 NOTE — Assessment & Plan Note (Signed)
Recent A1C of 6.1. Discussed the importance of a healthy diet and regular exercise in order for weight loss, and to reduce the risk of other medical diseases. Continue to monitor.

## 2017-01-27 NOTE — Patient Instructions (Addendum)
Start bupropion SR 150 mg tablets for tobacco abuse. Take 1 tablet by mouth once daily for three days, then advance to 1 tablet by mouth twice daily thereafter.  Choose a quit date before you start this medication. The quit date should be within the first two weeks of starting this medication.  We will call you in 4 weeks for an update.  Call to schedule your mammogram.  You will be contacted regarding your referral to GI for the colonoscopy.  Please let us know if you have not heard back within one week.   Reduce sweet tea, cereal, waffles, trail mix, and/or anything with extra sugar. Increase consumption of vegetables, fruit, whole grains.  Ensure you are consuming 64 ounces of water daily.  Start exercising. You should be getting 150 minutes of moderate intensity exercise weekly.  You were provided with a tetanus vaccination that will cover you for 10 years.  Try a calcium with vitamin D tablet to protect bones.  Follow up in 1 year for your annual exam or sooner if needed.  It was a pleasure to see you today!

## 2017-01-27 NOTE — Assessment & Plan Note (Signed)
Well managed on Tramadol.

## 2017-01-27 NOTE — Progress Notes (Signed)
Subjective:    Patient ID: Monica Daniel, female    DOB: 08/17/1966, 51 y.o.   MRN: [id number]  HPI  [name] is a 51 year old female who presents today for complete physical.  1) Tobacco abuse: Smoker of cigarettes for the past 30 years. She's tried Chantix and quitting cold [place] without improvement. Her last attempt to quit was 1 year ago. She is interested in trying Wellbutrin.   Immunizations: -Tetanus: Due. -Influenza: Did complete last season.   Diet: She endorses a healthy diet.  Breakfast: Cereal, waffle Lunch: Sandwich, granola bar Dinner: Meat, salads, some pasta, limited bread Snacks: Trail mix Desserts: Once monthly Beverages: Water, coffee, soda, sweet tea  Exercise: She does not currently exercise. Eye exam: Completed in 2018 Dental exam: Completes semi-annually Colonoscopy: Never completed.  Pap Smear: Hysterectomy Mammogram: Completed in May 2017   Review of Systems  Constitutional: Negative for unexpected weight change.  HENT: Negative for rhinorrhea.   Respiratory: Negative for cough and shortness of breath.   Cardiovascular: Negative for chest pain.  Gastrointestinal: Negative for constipation and diarrhea.  Genitourinary: Negative for difficulty urinating and menstrual problem.  Musculoskeletal: Positive for arthralgias. Negative for myalgias.       Well managed on Tramadol  Skin: Negative for rash.  Allergic/Immunologic: Negative for environmental allergies.  Neurological: Negative for dizziness, numbness and headaches.  Psychiatric/Behavioral:       Overall improved on Ambien       Past Medical History:  Diagnosis Date  . Allergy   . Arthritis   . Chickenpox   . Glaucoma   . Seizures [facility])      Social History   Social History  . Marital status: Married    Spouse name: N/A  . Number of children: N/A  . Years of education: N/A   Occupational History  . Not on file.   Social History Main Topics  . Smoking status: Current  Every Day Smoker    Packs/day: 0.50    Types: Cigarettes  . Smokeless tobacco: Never Used  . Alcohol use 0.0 oz/week     Comment: rarely  . Drug use: Unknown  . Sexual activity: Not on file   Other Topics Concern  . Not on file   Social History Narrative   Married.   2 children.   Works doing Radiographer, therapeutic.   Enjoys going to [employer], fishing, gardening.    Past Surgical History:  Procedure Laterality Date  . ABDOMINAL HYSTERECTOMY  2002  . REDUCTION MAMMAPLASTY Bilateral 20+yrs ago    Family History  Problem Relation Age of Onset  . Arthritis Father   . Lung cancer Father   . Hypertension Sister   . Pancreatic cancer Mother   . Diabetes Mother   . Diabetes Maternal Aunt     Allergies  Allergen Reactions  . Lac Bovis Nausea And Vomiting  . Penicillin G Rash    Current Outpatient Prescriptions on File Prior to Visit  Medication Sig Dispense Refill  . traMADol (ULTRAM) 50 MG tablet TAKE 1/2 TO 1 TABLET BY MOUTH ONCE A DAY AS NEEDED FOR PAIN 90 tablet 0  . zolpidem (AMBIEN) 10 MG tablet Take 1 tablet (10 mg total) by mouth at bedtime as needed. for sleep 90 tablet 1   No current facility-administered medications on file prior to visit.     BP 138/86   Pulse 67   Temp 97.9 F (36.6 C) (Oral)   Ht 5\' 1"  (1.549  m)   Wt 137 lb (62.1 kg)   SpO2 99%   BMI 25.89 kg/m    Objective:   Physical Exam  Constitutional: She is oriented to person, place, and time. She appears well-nourished.  HENT:  Right Ear: Tympanic membrane and ear canal normal.  Left Ear: Tympanic membrane and ear canal normal.  Nose: Nose normal.  Mouth/Throat: Oropharynx is clear and moist.  Eyes: Conjunctivae and EOM are normal. Pupils are equal, round, and reactive to light.  Neck: Neck supple. No thyromegaly present.  Cardiovascular: Normal rate and regular rhythm.   No murmur heard. Pulmonary/Chest: Effort normal and breath sounds normal. She has no rales.  Abdominal: Soft.  Bowel sounds are normal. There is no tenderness.  Musculoskeletal: Normal range of motion.  Lymphadenopathy:    She has no cervical adenopathy.  Neurological: She is alert and oriented to person, place, and time. She has normal reflexes. No cranial nerve deficit.  Skin: Skin is warm and dry. No rash noted.  Psychiatric: She has a normal mood and affect.          Assessment & Plan:

## 2017-01-27 NOTE — Assessment & Plan Note (Signed)
Ready to quit. Discussed treatment options, she elects for Wellbutrin as she's failed on Chantix and quitting cold [place]. Discussed instructions for use and potential side effects. Will check on her in 4 weeks.

## 2017-01-27 NOTE — Assessment & Plan Note (Signed)
Stable, continue vitamin D OTC.

## 2017-01-27 NOTE — Assessment & Plan Note (Signed)
Tdap due, provided today. Mammogram due in May, pending. Colonoscopy due, referral placed to GI. Discussed the importance of a healthy diet and regular exercise in order for weight loss, and to reduce the risk of other medical diseases. Exam unremarkable. Labs overall stable. Follow up in 1 year for annual exam.

## 2017-01-27 NOTE — Progress Notes (Signed)
Pre visit review using our clinic review tool, if applicable. No additional management support is needed unless otherwise documented below in the visit note. 

## 2017-02-10 DIAGNOSIS — K1120 Sialoadenitis, unspecified: Secondary | ICD-10-CM | POA: Diagnosis not present

## 2017-02-11 ENCOUNTER — Other Ambulatory Visit: Payer: Self-pay | Admitting: Otolaryngology

## 2017-02-11 DIAGNOSIS — K1120 Sialoadenitis, unspecified: Secondary | ICD-10-CM

## 2017-02-18 ENCOUNTER — Other Ambulatory Visit: Payer: Self-pay | Admitting: Primary Care

## 2017-02-18 DIAGNOSIS — M1990 Unspecified osteoarthritis, unspecified site: Secondary | ICD-10-CM

## 2017-02-19 ENCOUNTER — Ambulatory Visit
Admission: RE | Admit: 2017-02-19 | Discharge: 2017-02-19 | Disposition: A | Discharge status: Home / Self Care | Payer: 59 | Source: Ambulatory Visit | Attending: Otolaryngology | Admitting: Otolaryngology

## 2017-02-19 DIAGNOSIS — R590 Localized enlarged lymph nodes: Secondary | ICD-10-CM | POA: Insufficient documentation

## 2017-02-19 DIAGNOSIS — K1120 Sialoadenitis, unspecified: Secondary | ICD-10-CM | POA: Diagnosis present

## 2017-02-19 DIAGNOSIS — R220 Localized swelling, mass and lump, head: Secondary | ICD-10-CM | POA: Diagnosis not present

## 2017-02-19 HISTORY — DX: Malignant (primary) neoplasm, unspecified: C80.1

## 2017-02-19 MED ORDER — IOPAMIDOL (ISOVUE-300) INJECTION 61%
75.0000 mL | Freq: Once | INTRAVENOUS | Status: AC | PRN
  Administered 2017-02-19: 75 mL via INTRAVENOUS

## 2017-02-19 NOTE — Telephone Encounter (Signed)
Ok to refill? Electronically refill request for traMADol (ULTRAM) 50 MG tablet. Last prescribed on 10/28/2016. Controlled substance contract and UDS is updated. Next screening 04/28/2017. Last seen on 01/27/2017

## 2017-02-19 NOTE — Telephone Encounter (Signed)
Rx called in to pharmacy. 

## 2017-02-24 ENCOUNTER — Telehealth: Payer: Self-pay | Admitting: Primary Care

## 2017-02-24 NOTE — Telephone Encounter (Signed)
-----   Message from Pleas Koch, NP sent at 01/27/2017 11:24 AM EDT ----- Regarding: Tobacco Abuse How's she doing with smoking cessation since we started Wellbutrin?

## 2017-02-25 NOTE — Telephone Encounter (Signed)
Message left for patient to return my call.  

## 2017-02-27 ENCOUNTER — Telehealth: Payer: Self-pay

## 2017-02-27 NOTE — Telephone Encounter (Signed)
This has been addressed in telephone note on 02/27/2017

## 2017-02-27 NOTE — Telephone Encounter (Signed)
She will need to wean off of the Wellbutrin. Take 1 tablet by mouth once daily for 1 week, then 1 tablet every other day for 10 days, then stop.

## 2017-02-27 NOTE — Telephone Encounter (Signed)
Pt left v/m; pt having weird side effects from wellbutrin;feels jittery and anxiety x 10 and pt wants to know if can stop wellbutrin or does she have to gradually come off med if so request directions on how to stop med. Pt request cb.

## 2017-02-27 NOTE — Telephone Encounter (Signed)
Spoken and notified patient of Kate's comments. Patient verbalized understanding. 

## 2017-03-01 ENCOUNTER — Encounter: Payer: Self-pay | Admitting: Emergency Medicine

## 2017-03-01 ENCOUNTER — Emergency Department
Admission: EM | Admit: 2017-03-01 | Discharge: 2017-03-01 | Disposition: A | Discharge status: Home / Self Care | Payer: 59 | Attending: Emergency Medicine | Admitting: Emergency Medicine

## 2017-03-01 DIAGNOSIS — Z79899 Other long term (current) drug therapy: Secondary | ICD-10-CM | POA: Insufficient documentation

## 2017-03-01 DIAGNOSIS — M5432 Sciatica, left side: Secondary | ICD-10-CM

## 2017-03-01 DIAGNOSIS — M545 Low back pain: Secondary | ICD-10-CM | POA: Diagnosis present

## 2017-03-01 DIAGNOSIS — M5442 Lumbago with sciatica, left side: Secondary | ICD-10-CM | POA: Insufficient documentation

## 2017-03-01 DIAGNOSIS — F17210 Nicotine dependence, cigarettes, uncomplicated: Secondary | ICD-10-CM | POA: Diagnosis not present

## 2017-03-01 MED ORDER — KETOROLAC TROMETHAMINE 60 MG/2ML IM SOLN
30.0000 mg | Freq: Once | INTRAMUSCULAR | Status: AC
  Administered 2017-03-01: 30 mg via INTRAMUSCULAR
  Filled 2017-03-01: qty 2

## 2017-03-01 MED ORDER — PREDNISONE 10 MG (21) PO TBPK: ORAL_TABLET | ORAL | 0 refills | Status: DC

## 2017-03-01 MED ORDER — METHYLPREDNISOLONE SODIUM SUCC 125 MG IJ SOLR
125.0000 mg | Freq: Once | INTRAMUSCULAR | Status: AC
  Administered 2017-03-01: 125 mg via INTRAMUSCULAR
  Filled 2017-03-01: qty 2

## 2017-03-01 NOTE — ED Notes (Signed)
See triage note  States she developed lower back pain last Tuesday  Pain eased off with stretching and heat..then pain returned on Friday  Pain moves into left leg also has been having some muscle spasms  Recently started on Wellbutrin prior to sx's starting

## 2017-03-01 NOTE — ED Provider Notes (Signed)
[facility] Emergency Department Provider Note  ____________________________________________  Time seen: Approximately 9:30 AM  I have reviewed the triage vital signs and the nursing notes.   HISTORY  Chief Complaint Back Pain    HPI Monica Daniel is a 51 y.o. female that presents to the emergency department with low left-sided back pain for 2 days. Patient states that pain begins in her lower back and radiates down the back of her left leg. Every time she stands up straight, she gets a shooting pain down her leg. She is unable to lay on her back. She has a history of sciatica but has not had a flare in several years. This pain feels similar but worse than previously. She took 400 mg of ibuprofen last night. She denies headache, shortness of breath, chest pain, nausea, vomiting, abdominal pain, dysuria, urgency, frequency, bowel or bladder dysfunction, saddle paresthesias, numbness, tingling.   Past Medical History:  Diagnosis Date  . Allergy   . Arthritis   . Cancer ([place])   . Chickenpox   . Glaucoma   . Seizures [facility])     Patient Active Problem List   Diagnosis Date Noted  . Tobacco abuse 01/27/2017  . Insomnia 10/28/2016  . Preventative health care 12/13/2015  . Borderline diabetes 12/13/2015  . Vitamin D deficiency 12/13/2015  . Arthritis 08/23/2015  . Glaucoma 08/23/2015  . Elevated blood pressure reading 08/23/2015    Past Surgical History:  Procedure Laterality Date  . ABDOMINAL HYSTERECTOMY  2002  . REDUCTION MAMMAPLASTY Bilateral 20+yrs ago    Prior to Admission medications   Medication Sig Start Date End Date Taking? Authorizing Provider  buPROPion (WELLBUTRIN SR) 150 MG 12 hr tablet Take 1 tablet (150 mg total) by mouth 2 (two) times daily. 01/27/17   Pleas Koch, NP  predniSONE (STERAPRED UNI-PAK 21 TAB) 10 MG (21) TBPK tablet Take 6 tablets on day 1, take 5 tablets on day 2, take 4 tablets on day 3, take 3 tablets on day 4,  take 2 tablets on day 5, take 1 tablet on day 6 03/01/17   Laban Emperor, PA-C  traMADol (ULTRAM) 50 MG tablet TAKE 1/2 TO 1 TABLET BY MOUTH EVERY DAY AS NEEDED FOR PAIN 02/19/17   Pleas Koch, NP  zolpidem (AMBIEN) 10 MG tablet Take 1 tablet (10 mg total) by mouth at bedtime as needed. for sleep 10/28/16   Pleas Koch, NP    Allergies Lac bovis and Penicillin g  Family History  Problem Relation Age of Onset  . Arthritis Father   . Lung cancer Father   . Hypertension Sister   . Pancreatic cancer Mother   . Diabetes Mother   . Diabetes Maternal Aunt     Social History Social History  Substance Use Topics  . Smoking status: Current Every Day Smoker    Packs/day: 0.50    Types: Cigarettes  . Smokeless tobacco: Never Used  . Alcohol use 0.0 oz/week     Comment: rarely     Review of Systems  Constitutional: No fever/chills Cardiovascular: No chest pain. Respiratory: No SOB. Gastrointestinal: No abdominal pain.  No nausea, no vomiting.  Genitourinary: Negative for dysuria. Musculoskeletal: Positive for back pain. Skin: Negative for rash, abrasions, lacerations, ecchymosis. Neurological: Negative for headaches   ____________________________________________   PHYSICAL EXAM:  VITAL SIGNS: ED Triage Vitals  Enc Vitals Group     BP 03/01/17 0901 (!) 143/77     Pulse Rate 03/01/17 0901 96  Resp 03/01/17 0901 18     Temp 03/01/17 0901 98.7 F (37.1 C)     Temp Source 03/01/17 0901 Oral     SpO2 03/01/17 0901 99 %     Weight 03/01/17 0902 130 lb (59 kg)     Height 03/01/17 0902 4\' 11"  (1.499 m)     Head Circumference --      Peak Flow --      Pain Score --      Pain Loc --      Pain Edu? --      Excl. in [place]? --      Constitutional: Alert and oriented. Well appearing and in no acute distress. Eyes: Conjunctivae are normal. PERRL. EOMI. Head: Atraumatic. ENT:      Ears:      Nose: No congestion/rhinnorhea.      Mouth/Throat: Mucous membranes are  moist.  Neck: No stridor. No cervical spine tenderness to palpation. Cardiovascular: Normal rate, regular rhythm.  Good peripheral circulation. Respiratory: Normal respiratory effort without tachypnea or retractions. Lungs CTAB. Good air entry to the bases with no decreased or absent breath sounds. Gastrointestinal: Bowel sounds 4 quadrants. Soft and nontender to palpation. No guarding or rigidity. No palpable masses. No distention.  Musculoskeletal: Full range of motion to all extremities. No gross deformities appreciated. Tender to palpation over left SI joint. Patient unable to tolerate straight leg raise or cross leg test. Neurologic:  Normal speech and language. No gross focal neurologic deficits are appreciated.  Skin:  Skin is warm, dry and intact. No rash noted.   ____________________________________________   LABS (all labs ordered are listed, but only abnormal results are displayed)  Labs Reviewed - No data to display ____________________________________________  EKG   ____________________________________________  RADIOLOGY   No results found.  ____________________________________________    PROCEDURES  Procedure(s) performed:    Procedures    Medications  ketorolac (TORADOL) injection 30 mg (30 mg Intramuscular Given 03/01/17 0955)  methylPREDNISolone sodium succinate (SOLU-MEDROL) 125 mg/2 mL injection 125 mg (125 mg Intramuscular Given 03/01/17 0954)     ____________________________________________   INITIAL IMPRESSION / ASSESSMENT AND PLAN / ED COURSE  Pertinent labs & imaging results that were available during my care of the patient were reviewed by me and considered in my medical decision making (see chart for details).  Review of the [place] CSRS was performed in accordance of the [place] prior to dispensing any controlled drugs.   Patient's diagnosis is consistent with sciatica. Vital signs and exam are reassuring. Patient was given solumedrol and  tordol in ED. She was able to get up and walk around room. Patient will be discharged home with prescriptions for prednisone. Patient is to follow up with PCP as directed. Patient is given ED precautions to return to the ED for any worsening or new symptoms.     ____________________________________________  FINAL CLINICAL IMPRESSION(S) / ED DIAGNOSES  Final diagnoses:  Sciatica of left side      NEW MEDICATIONS STARTED DURING THIS VISIT:  Discharge Medication List as of 03/01/2017 10:17 AM    START taking these medications   Details  predniSONE (STERAPRED UNI-PAK 21 TAB) 10 MG (21) TBPK tablet Take 6 tablets on day 1, take 5 tablets on day 2, take 4 tablets on day 3, take 3 tablets on day 4, take 2 tablets on day 5, take 1 tablet on day 6, Print            This chart was dictated using  voice recognition software/Dragon. Despite best efforts to proofread, errors can occur which can change the meaning. Any change was purely unintentional.    Laban Emperor, PA-C 03/01/17 [place], MD 03/03/17 [phone]

## 2017-03-01 NOTE — ED Triage Notes (Signed)
Pt c/o lower back pain since Friday. Denies injury. Reports sciatica pain in past. Denies urinary symptoms. Movement worsens.

## 2017-03-02 ENCOUNTER — Encounter: Payer: Self-pay | Admitting: Primary Care

## 2017-03-04 DIAGNOSIS — M5416 Radiculopathy, lumbar region: Secondary | ICD-10-CM | POA: Diagnosis not present

## 2017-03-04 DIAGNOSIS — M545 Low back pain: Secondary | ICD-10-CM | POA: Diagnosis not present

## 2017-03-10 DIAGNOSIS — M5416 Radiculopathy, lumbar region: Secondary | ICD-10-CM | POA: Diagnosis not present

## 2017-03-10 DIAGNOSIS — M545 Low back pain: Secondary | ICD-10-CM | POA: Diagnosis not present

## 2017-03-12 DIAGNOSIS — M545 Low back pain: Secondary | ICD-10-CM | POA: Diagnosis not present

## 2017-03-12 DIAGNOSIS — M5416 Radiculopathy, lumbar region: Secondary | ICD-10-CM | POA: Diagnosis not present

## 2017-03-18 DIAGNOSIS — M545 Low back pain: Secondary | ICD-10-CM | POA: Diagnosis not present

## 2017-03-18 DIAGNOSIS — M5416 Radiculopathy, lumbar region: Secondary | ICD-10-CM | POA: Diagnosis not present

## 2017-03-24 DIAGNOSIS — M545 Low back pain: Secondary | ICD-10-CM | POA: Diagnosis not present

## 2017-03-24 DIAGNOSIS — M5416 Radiculopathy, lumbar region: Secondary | ICD-10-CM | POA: Diagnosis not present

## 2017-04-15 ENCOUNTER — Ambulatory Visit: Payer: 59

## 2017-04-15 DIAGNOSIS — M5416 Radiculopathy, lumbar region: Secondary | ICD-10-CM | POA: Diagnosis not present

## 2017-04-23 DIAGNOSIS — H40053 Ocular hypertension, bilateral: Secondary | ICD-10-CM | POA: Diagnosis not present

## 2017-04-27 ENCOUNTER — Other Ambulatory Visit: Payer: Self-pay | Admitting: Primary Care

## 2017-04-27 DIAGNOSIS — Z720 Tobacco use: Secondary | ICD-10-CM

## 2017-05-07 ENCOUNTER — Ambulatory Visit
Admission: RE | Admit: 2017-05-07 | Discharge: 2017-05-07 | Disposition: A | Discharge status: Home / Self Care | Payer: 59 | Source: Ambulatory Visit | Attending: Primary Care | Admitting: Primary Care

## 2017-05-07 DIAGNOSIS — Z1231 Encounter for screening mammogram for malignant neoplasm of breast: Secondary | ICD-10-CM | POA: Diagnosis not present

## 2017-05-07 DIAGNOSIS — Z1239 Encounter for other screening for malignant neoplasm of breast: Secondary | ICD-10-CM

## 2017-05-20 ENCOUNTER — Encounter: Payer: Self-pay | Admitting: Primary Care

## 2017-06-08 ENCOUNTER — Other Ambulatory Visit: Payer: Self-pay | Admitting: Primary Care

## 2017-06-08 DIAGNOSIS — G4700 Insomnia, unspecified: Secondary | ICD-10-CM

## 2017-06-08 DIAGNOSIS — M1990 Unspecified osteoarthritis, unspecified site: Secondary | ICD-10-CM

## 2017-06-08 NOTE — Telephone Encounter (Signed)
Called in medication to the pharmacy as instructed. 

## 2017-06-08 NOTE — Telephone Encounter (Signed)
Ok to refill? Electronically refill request for   zolpidem (AMBIEN) 10 MG tablet  Last prescribed on 10/28/2016  traMADol (ULTRAM) 50 MG tablet  Last prescribed on 02/19/2017.  Last seen on 01/27/2017

## 2017-06-08 NOTE — Telephone Encounter (Signed)
Approved to fill both. Please call in as they are listed in the chart. Ensure UDS and contract UTD.

## 2017-08-20 DIAGNOSIS — R300 Dysuria: Secondary | ICD-10-CM | POA: Diagnosis not present

## 2017-08-20 DIAGNOSIS — N3090 Cystitis, unspecified without hematuria: Secondary | ICD-10-CM | POA: Diagnosis not present

## 2017-09-16 ENCOUNTER — Other Ambulatory Visit: Payer: Self-pay | Admitting: Primary Care

## 2017-09-16 DIAGNOSIS — G4700 Insomnia, unspecified: Secondary | ICD-10-CM

## 2017-09-17 NOTE — Telephone Encounter (Signed)
Ok to refill. Do you want me to print and have her come pick up to sign a new contract and give UDS?

## 2017-09-17 NOTE — Telephone Encounter (Signed)
Yes, please.

## 2017-09-17 NOTE — Telephone Encounter (Signed)
Ok to refill? Electronically refill request for zolpidem (AMBIEN) 10 MG tablet  Last prescribed on 06/08/2017. Last seen on 01/27/2017. Need to update controlled substance contract signed, uds sample.

## 2017-09-21 NOTE — Telephone Encounter (Signed)
Spoken to patient on 09/18/2017. Notified patient that she will need to come in and pick up Rx since controlled substance contract need updated.

## 2017-09-24 ENCOUNTER — Other Ambulatory Visit: Payer: 59

## 2017-09-24 ENCOUNTER — Encounter: Payer: Self-pay | Admitting: Primary Care

## 2017-09-24 DIAGNOSIS — Z0283 Encounter for blood-alcohol and blood-drug test: Secondary | ICD-10-CM

## 2017-09-25 LAB — PAIN MGMT, PROFILE 8 W/CONF, U
6 ACETYLMORPHINE: NEGATIVE ng/mL (ref <10)
ALCOHOL METABOLITES: NEGATIVE ng/mL (ref <500)
Amphetamines: NEGATIVE ng/mL (ref <500)
Benzodiazepines: NEGATIVE ng/mL (ref <100)
Buprenorphine, Urine: NEGATIVE ng/mL (ref <5)
COCAINE METABOLITE: NEGATIVE ng/mL (ref <150)
Creatinine: 84.7 mg/dL
MARIJUANA METABOLITE: NEGATIVE ng/mL (ref <20)
MDMA: NEGATIVE ng/mL (ref <500)
OPIATES: NEGATIVE ng/mL (ref <100)
Oxidant: NEGATIVE ug/mL (ref <200)
Oxycodone: NEGATIVE ng/mL (ref <100)
pH: 6.75 (ref 4.5–9.0)

## 2017-10-15 IMAGING — CT CT NECK W/ CM
2 of 3 series · 7 of 14 positions shown, 8 images · IV contrast (iopamidol)
Comparison: None.

CLINICAL DATA: Periodic sialadenosis. Bright submandibular swelling
intermittently.

EXAM:
CT NECK WITH CONTRAST
TECHNIQUE: Multidetector CT imaging of the neck was performed using the
standard protocol following the bolus administration of intravenous
contrast.
CONTRAST:  75mL UV25GL-U22 IOPAMIDOL (UV25GL-U22) INJECTION 61%

[Series 2: axial neck · axial · 0.48mm/px · z∈[-198,-82]mm · 3 of 117 slices shown]
[im 30/117  bone]
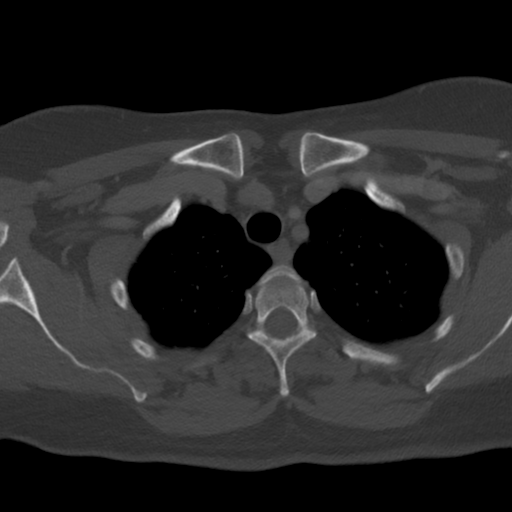
[im 59/117  bone]
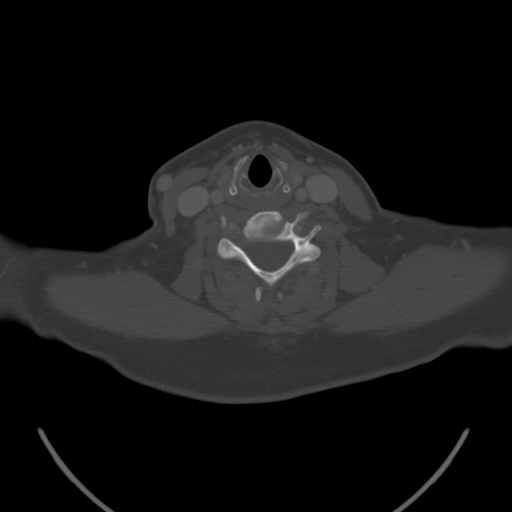
[im 88/117  bone]
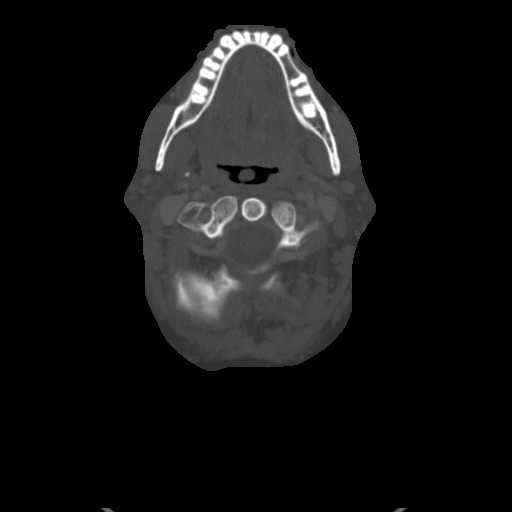

[Series 7: orthogonal ax · axial · 0.37mm/px · z∈[-211,-69]mm · 4 of 119 slices shown, 5 images]
[im 24/119  soft-tissue]
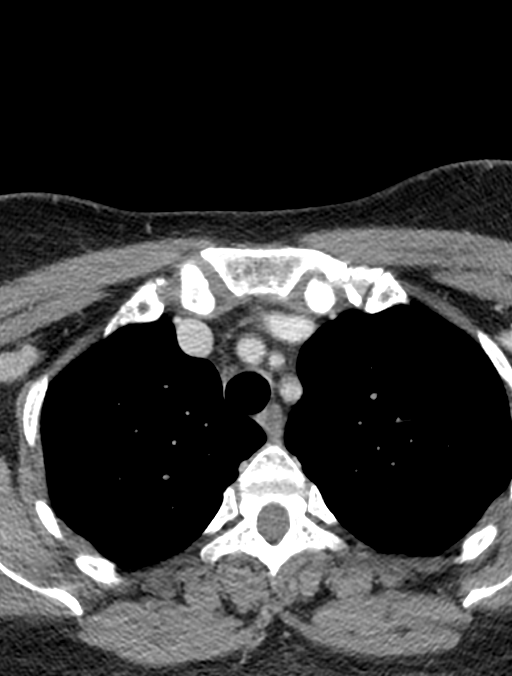
[im 24/119  bone]
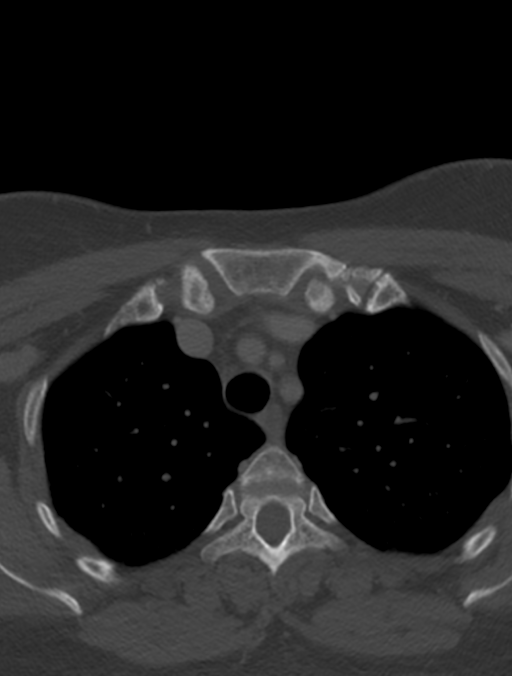
[im 48/119  bone]
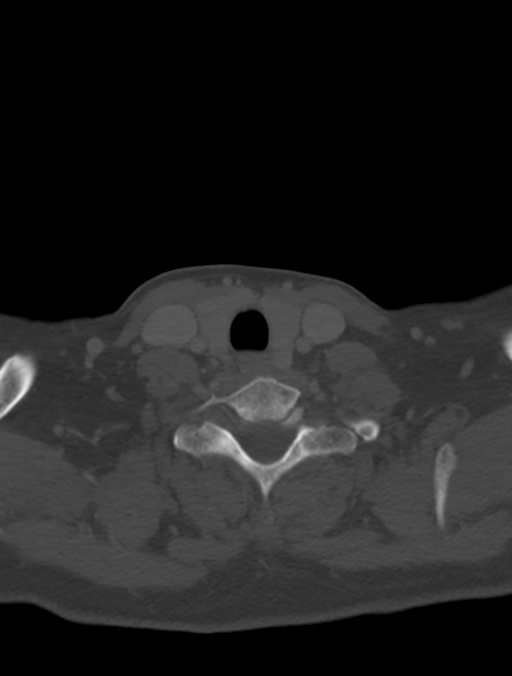
[im 71/119  bone]
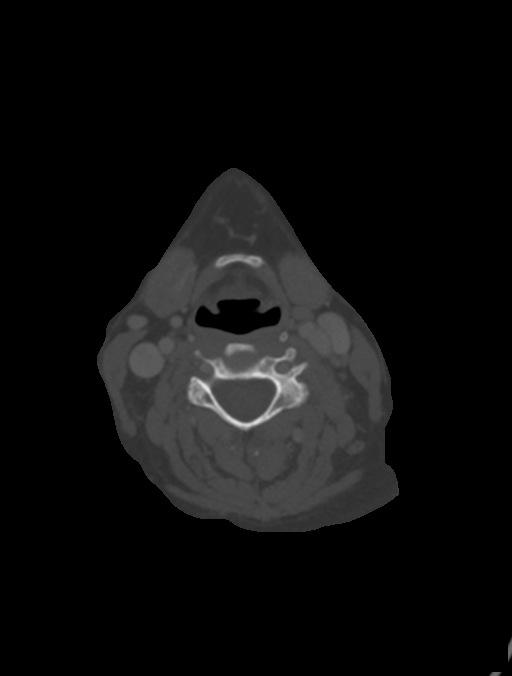
[im 95/119  bone]
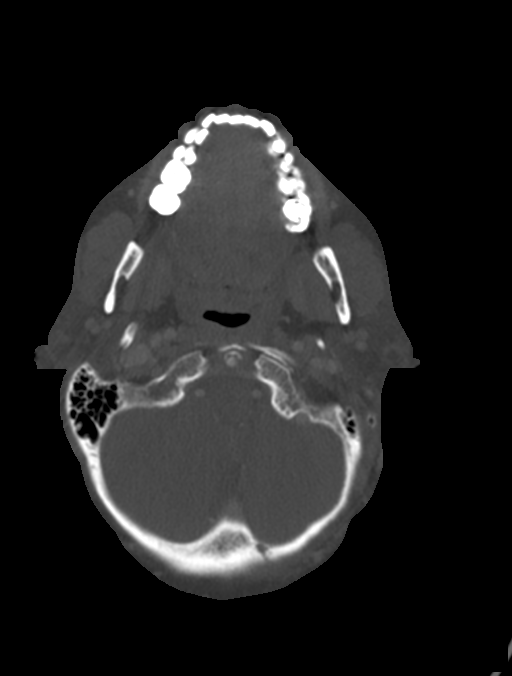

[7 of 14 positions shown; findings below may reference images not displayed]

FINDINGS: Pharynx and larynx: No evidence of mass or parapharyngeal
inflammatory/retropharyngeal inflammatory change. A torus palatinus
is incidentally noted.

Salivary glands: The right submandibular gland is at most minimally
larger than the left. No discrete mass, inflammatory changes, or
calculi are identified. The left submandibular gland and both
parotid glands are unremarkable.

Thyroid: Unremarkable.

Lymph nodes: No enlarged lymph nodes are identified in the neck. A
few normal sized right level IIa lymph nodes at the level of the
palpable marker measure up to 7 mm in short axis and are benign in
appearance.

Vascular: Major vascular structures of the neck appear patent.

Limited intracranial: Unremarkable.

Visualized orbits: Not imaged.

Mastoids and visualized paranasal sinuses: Clear.

Skeleton: No suspicious osseous lesion.

Upper chest: Mild paraseptal emphysema in the right upper lobe.

Other: None.
IMPRESSION: At most minimal asymmetric enlargement of the right submandibular
gland without acute inflammation, stone, or focal mass.

## 2017-10-27 ENCOUNTER — Other Ambulatory Visit: Payer: Self-pay | Admitting: Primary Care

## 2017-10-27 DIAGNOSIS — J0141 Acute recurrent pansinusitis: Secondary | ICD-10-CM | POA: Diagnosis not present

## 2017-10-27 DIAGNOSIS — R030 Elevated blood-pressure reading, without diagnosis of hypertension: Secondary | ICD-10-CM | POA: Diagnosis not present

## 2017-10-27 DIAGNOSIS — M1990 Unspecified osteoarthritis, unspecified site: Secondary | ICD-10-CM

## 2017-10-28 NOTE — Telephone Encounter (Signed)
Okay to refill, #90, no refills.  Please notify her that she will need an office visit in March 2019 for any further refills.  Looks like she is due for her physical around that time, please schedule.

## 2017-10-28 NOTE — Telephone Encounter (Signed)
Ok to refill? Electronically refill request for traMADol (ULTRAM) 50 MG tablet  Last prescribed on 06/08/2017. Last seen on 01/27/2017

## 2017-10-28 NOTE — Telephone Encounter (Signed)
Message left for patient to return my call.  

## 2017-10-28 NOTE — Telephone Encounter (Signed)
Called in medication to the pharmacy as instructed. 

## 2017-10-29 NOTE — Telephone Encounter (Signed)
Send patient a message through MyChart. 

## 2017-11-19 ENCOUNTER — Ambulatory Visit: Payer: 59 | Admitting: Primary Care

## 2017-11-19 ENCOUNTER — Encounter: Payer: Self-pay | Admitting: Primary Care

## 2017-11-19 VITALS — BP 162/84 | HR 77 | Temp 98.3°F | Wt 134.0 lb

## 2017-11-19 DIAGNOSIS — I10 Essential (primary) hypertension: Secondary | ICD-10-CM | POA: Diagnosis not present

## 2017-11-19 MED ORDER — LISINOPRIL 10 MG PO TABS: 10.0000 mg | ORAL_TABLET | Freq: Every day | ORAL | 0 refills | Status: DC

## 2017-11-19 NOTE — Assessment & Plan Note (Signed)
Numerous office readings above goal, now home readings are consistently elevated. Rx for Lisinopril 10 mg sent to pharmacy. Will have her monitor BP and follow up in 2-3 weeks for re-evaluation and BMP.

## 2017-11-19 NOTE — Progress Notes (Signed)
Subjective:    Patient ID: Monica Daniel, female    DOB: Jan 26, 1966, 52 y.o.   MRN: [id number]  HPI  Monica Daniel is a 52 year old female who presents today with a chief complaint of elevated blood pressure readings.  This was first noticed by her at her eye appointment in early December 2018 for which her BP was 140/80. She's been closely monitoring her blood pressure since mid December 2018 with readings that are running 150/70-80's.   She has noticed some headaches but has a new glasses prescription. She also reports some anxiety but thinks this is stemming from her elevated BP readings. She's also cut back on cigarettes and has stopped drinking soda. She denies dizziness, chest pain.   BP Readings from Last 3 Encounters:  11/19/17 (!) 162/84  03/01/17 (!) 143/77  01/27/17 138/86   Wt Readings from Last 3 Encounters:  11/19/17 134 lb (60.8 kg)  03/01/17 130 lb (59 kg)  01/27/17 137 lb (62.1 kg)     Review of Systems  Eyes: Negative for visual disturbance.  Respiratory: Negative for shortness of breath.   Cardiovascular: Negative for chest pain.  Neurological: Positive for headaches. Negative for dizziness.  Psychiatric/Behavioral: The patient is nervous/anxious.        Past Medical History:  Diagnosis Date  . Allergy   . Arthritis   . Cancer ([place])   . Chickenpox   . Glaucoma   . Seizures ([place])      Social History   Socioeconomic History  . Marital status: Married    Spouse name: Not on file  . Number of children: Not on file  . Years of education: Not on file  . Highest education level: Not on file  Social Needs  . Financial resource strain: Not on file  . Food insecurity - worry: Not on file  . Food insecurity - inability: Not on file  . Transportation needs - medical: Not on file  . Transportation needs - non-medical: Not on file  Occupational History  . Not on file  Tobacco Use  . Smoking status: Current Every Day Smoker    Packs/day: 0.50   Types: Cigarettes  . Smokeless tobacco: Never Used  Substance and Sexual Activity  . Alcohol use: Yes    Alcohol/week: 0.0 oz    Comment: rarely  . Drug use: Not on file  . Sexual activity: Not on file  Other Topics Concern  . Not on file  Social History Narrative   Married.   2 children.   Works doing Radiographer, therapeutic.   Enjoys going to [employer], fishing, gardening.    Past Surgical History:  Procedure Laterality Date  . ABDOMINAL HYSTERECTOMY  2002  . REDUCTION MAMMAPLASTY Bilateral 20+yrs ago    Family History  Problem Relation Age of Onset  . Arthritis Father   . Lung cancer Father   . Hypertension Sister   . Pancreatic cancer Mother   . Diabetes Mother   . Diabetes Maternal Aunt     Allergies  Allergen Reactions  . Lac Bovis Nausea And Vomiting  . Penicillin G Rash    Current Outpatient Medications on File Prior to Visit  Medication Sig Dispense Refill  . traMADol (ULTRAM) 50 MG tablet TAKE 1/2 TO 1 TABLET BY MOUTH EVERY DAY AS NEEDED 90 tablet 0  . zolpidem (AMBIEN) 10 MG tablet TAKE 1 TABLET BY MOUTH AT BEDTIME AS NEEDED FOR SLEEP 90 tablet 0   No current facility-administered  medications on file prior to visit.     BP (!) 162/84   Pulse 77   Temp 98.3 F (36.8 C) (Oral)   Wt 134 lb (60.8 kg)   SpO2 98%   BMI 27.06 kg/m    Objective:   Physical Exam  Constitutional: She appears well-nourished.  Neck: Neck supple.  Cardiovascular: Normal rate and regular rhythm.  Pulmonary/Chest: Effort normal and breath sounds normal.  Skin: Skin is warm and dry.          Assessment & Plan:

## 2017-11-19 NOTE — Patient Instructions (Signed)
Start lisinopril 10 mg tablets for high blood pressure. Take 1 tablet by mouth once daily.  Continue to monitor your blood pressure daily as discussed.  Schedule a follow up visit in 2-3 weeks for blood pressure recheck.  It was a pleasure to see you today!

## 2017-12-03 ENCOUNTER — Ambulatory Visit: Payer: 59 | Admitting: Primary Care

## 2017-12-03 ENCOUNTER — Encounter: Payer: Self-pay | Admitting: Primary Care

## 2017-12-03 VITALS — BP 128/82 | HR 70 | Temp 98.1°F | Ht 61.0 in | Wt 135.8 lb

## 2017-12-03 DIAGNOSIS — G8929 Other chronic pain: Secondary | ICD-10-CM

## 2017-12-03 DIAGNOSIS — I10 Essential (primary) hypertension: Secondary | ICD-10-CM

## 2017-12-03 DIAGNOSIS — M545 Low back pain, unspecified: Secondary | ICD-10-CM | POA: Insufficient documentation

## 2017-12-03 LAB — BASIC METABOLIC PANEL
BUN: 18 mg/dL (ref 6–23)
CO2: 30 mEq/L (ref 19–32)
CREATININE: 0.71 mg/dL (ref 0.40–1.20)
Calcium: 9.7 mg/dL (ref 8.4–10.5)
Chloride: 104 mEq/L (ref 96–112)
GFR: 92.18 mL/min (ref >60.00)
GLUCOSE: 118 mg/dL — AB (ref 70–99)
POTASSIUM: 3.7 meq/L (ref 3.5–5.1)
Sodium: 139 mEq/L (ref 135–145)

## 2017-12-03 MED ORDER — LISINOPRIL 10 MG PO TABS: 10.0000 mg | ORAL_TABLET | Freq: Every day | ORAL | 3 refills | Status: DC

## 2017-12-03 NOTE — Patient Instructions (Signed)
You will be contacted regarding your referral to Physical Medicine.  Please let us know if you have not been contacted within one week.   Continue Lisinopril 10 mg tablets for high blood pressure.  We will see you in March! It was a pleasure to see you today!

## 2017-12-03 NOTE — Progress Notes (Signed)
Subjective:    Patient ID: Monica Daniel, female    DOB: 1966/05/15, 52 y.o.   MRN: [id number]  HPI  Monica Daniel is a 52 year old female who presents today for follow up of hypertension. She'd also like to get set up with someone who can do injections to her lower back for a herniated disc.  She is currently managed on lisinopril 10 mg tablets which were initiated during her last visit on 11/19/17. She had several documented office and home readings during her last visit.   Since her last visit she's feeling much improved including headaches and overall fatigue. She's checking her BP at home and is getting readings of mid 110's-120/70's. She denies chest pain, dizziness, cough.  BP Readings from Last 3 Encounters:  12/03/17 128/82  11/19/17 (!) 162/84  03/01/17 (!) 143/77   2) Chronic Back Pain: Presented to [facility] one year ago for acute back pain. She was found to have a herniated disc to the L4 region and was sent to their [facility] office for a steroid injection. She found much relief from her injection which was 8 months ago but would like another and doesn't wish to drive to [facility] like to get connected with someone closer.   Review of Systems  Constitutional: Negative for fatigue.  Respiratory: Negative for shortness of breath.   Cardiovascular: Negative for chest pain.  Musculoskeletal:       Chronic back pain  Neurological: Negative for dizziness and headaches.       Past Medical History:  Diagnosis Date  . Allergy   . Arthritis   . Cancer ([place])   . Chickenpox   . Glaucoma   . Seizures ([place])      Social History   Socioeconomic History  . Marital status: Married    Spouse name: Not on file  . Number of children: Not on file  . Years of education: Not on file  . Highest education level: Not on file  Social Needs  . Financial resource strain: Not on file  . Food insecurity - worry: Not on file  . Food insecurity - inability: Not on file  .  Transportation needs - medical: Not on file  . Transportation needs - non-medical: Not on file  Occupational History  . Not on file  Tobacco Use  . Smoking status: Current Every Day Smoker    Packs/day: 0.50    Types: Cigarettes  . Smokeless tobacco: Never Used  Substance and Sexual Activity  . Alcohol use: Yes    Alcohol/week: 0.0 oz    Comment: rarely  . Drug use: Not on file  . Sexual activity: Not on file  Other Topics Concern  . Not on file  Social History Narrative   Married.   2 children.   Works doing Radiographer, therapeutic.   Enjoys going to [employer], fishing, gardening.    Past Surgical History:  Procedure Laterality Date  . ABDOMINAL HYSTERECTOMY  2002  . REDUCTION MAMMAPLASTY Bilateral 20+yrs ago    Family History  Problem Relation Age of Onset  . Arthritis Father   . Lung cancer Father   . Hypertension Sister   . Pancreatic cancer Mother   . Diabetes Mother   . Diabetes Maternal Aunt     Allergies  Allergen Reactions  . Lac Bovis Nausea And Vomiting  . Penicillin G Rash    Current Outpatient Medications on File Prior to Visit  Medication Sig Dispense Refill  .  traMADol (ULTRAM) 50 MG tablet TAKE 1/2 TO 1 TABLET BY MOUTH EVERY DAY AS NEEDED 90 tablet 0  . zolpidem (AMBIEN) 10 MG tablet TAKE 1 TABLET BY MOUTH AT BEDTIME AS NEEDED FOR SLEEP 90 tablet 0   No current facility-administered medications on file prior to visit.     BP 128/82   Pulse 70   Temp 98.1 F (36.7 C) (Oral)   Ht 5\' 1"  (1.549 m)   Wt 135 lb 12.8 oz (61.6 kg)   SpO2 98%   BMI 25.66 kg/m    Objective:   Physical Exam  Constitutional: She appears well-nourished.  Neck: Neck supple.  Cardiovascular: Normal rate and regular rhythm.  Pulmonary/Chest: Effort normal and breath sounds normal.  Skin: Skin is warm and dry.  Psychiatric: She has a normal mood and affect.          Assessment & Plan:

## 2017-12-03 NOTE — Assessment & Plan Note (Signed)
Since last year, did well on steroid injections. Referral placed to physical medicine for further injections and evaluation.

## 2017-12-03 NOTE — Assessment & Plan Note (Signed)
Improved on lisinopril 10 mg, continue same. BMP pending. Refills sent to pharmacy.

## 2018-01-12 ENCOUNTER — Other Ambulatory Visit: Payer: Self-pay | Admitting: Internal Medicine

## 2018-01-12 ENCOUNTER — Other Ambulatory Visit: Payer: Self-pay | Admitting: Primary Care

## 2018-01-12 DIAGNOSIS — G4700 Insomnia, unspecified: Secondary | ICD-10-CM

## 2018-01-12 DIAGNOSIS — E559 Vitamin D deficiency, unspecified: Secondary | ICD-10-CM

## 2018-01-12 DIAGNOSIS — R7303 Prediabetes: Secondary | ICD-10-CM

## 2018-01-12 DIAGNOSIS — I10 Essential (primary) hypertension: Secondary | ICD-10-CM

## 2018-01-13 NOTE — Telephone Encounter (Signed)
Scheduled for CPE in late March 2019, refill sent to pharmacy. UDS is up to date.

## 2018-01-13 NOTE — Telephone Encounter (Signed)
Last filled 09/17/18 #90, please advise

## 2018-01-22 ENCOUNTER — Other Ambulatory Visit (INDEPENDENT_AMBULATORY_CARE_PROVIDER_SITE_OTHER): Payer: 59

## 2018-01-22 ENCOUNTER — Encounter: Payer: Self-pay | Admitting: Primary Care

## 2018-01-22 DIAGNOSIS — E559 Vitamin D deficiency, unspecified: Secondary | ICD-10-CM

## 2018-01-22 DIAGNOSIS — R7303 Prediabetes: Secondary | ICD-10-CM | POA: Diagnosis not present

## 2018-01-22 DIAGNOSIS — I10 Essential (primary) hypertension: Secondary | ICD-10-CM

## 2018-01-22 LAB — LDL CHOLESTEROL, DIRECT: LDL DIRECT: 108 mg/dL

## 2018-01-22 LAB — HEPATIC FUNCTION PANEL
ALBUMIN: 4.4 g/dL (ref 3.5–5.2)
ALT: 14 U/L (ref 0–35)
AST: 12 U/L (ref 0–37)
Alkaline Phosphatase: 83 U/L (ref 39–117)
Bilirubin, Direct: 0.1 mg/dL (ref 0.0–0.3)
Total Bilirubin: 0.5 mg/dL (ref 0.2–1.2)
Total Protein: 7 g/dL (ref 6.0–8.3)

## 2018-01-22 LAB — LIPID PANEL
CHOL/HDL RATIO: 4
Cholesterol: 167 mg/dL (ref 0–200)
HDL: 39.9 mg/dL (ref >39.00)
NONHDL: 127.15
TRIGLYCERIDES: 212 mg/dL — AB (ref 0.0–149.0)
VLDL: 42.4 mg/dL — ABNORMAL HIGH (ref 0.0–40.0)

## 2018-01-22 LAB — HEMOGLOBIN A1C: Hgb A1c MFr Bld: 6.5 % (ref 4.6–6.5)

## 2018-01-22 LAB — VITAMIN D 25 HYDROXY (VIT D DEFICIENCY, FRACTURES): VITD: 39.08 ng/mL (ref 30.00–100.00)

## 2018-01-29 ENCOUNTER — Encounter: Payer: Self-pay | Admitting: Primary Care

## 2018-01-29 ENCOUNTER — Ambulatory Visit (INDEPENDENT_AMBULATORY_CARE_PROVIDER_SITE_OTHER): Payer: 59 | Admitting: Primary Care

## 2018-01-29 VITALS — BP 112/78 | HR 78 | Temp 97.8°F | Ht 60.5 in | Wt 133.0 lb

## 2018-01-29 DIAGNOSIS — M1990 Unspecified osteoarthritis, unspecified site: Secondary | ICD-10-CM

## 2018-01-29 DIAGNOSIS — M545 Low back pain: Secondary | ICD-10-CM | POA: Diagnosis not present

## 2018-01-29 DIAGNOSIS — Z0001 Encounter for general adult medical examination with abnormal findings: Secondary | ICD-10-CM | POA: Diagnosis not present

## 2018-01-29 DIAGNOSIS — Z1231 Encounter for screening mammogram for malignant neoplasm of breast: Secondary | ICD-10-CM | POA: Diagnosis not present

## 2018-01-29 DIAGNOSIS — I10 Essential (primary) hypertension: Secondary | ICD-10-CM

## 2018-01-29 DIAGNOSIS — E119 Type 2 diabetes mellitus without complications: Secondary | ICD-10-CM | POA: Diagnosis not present

## 2018-01-29 DIAGNOSIS — G4700 Insomnia, unspecified: Secondary | ICD-10-CM

## 2018-01-29 DIAGNOSIS — Z0000 Encounter for general adult medical examination without abnormal findings: Secondary | ICD-10-CM

## 2018-01-29 DIAGNOSIS — G8929 Other chronic pain: Secondary | ICD-10-CM | POA: Diagnosis not present

## 2018-01-29 DIAGNOSIS — Z1239 Encounter for other screening for malignant neoplasm of breast: Secondary | ICD-10-CM

## 2018-01-29 MED ORDER — METFORMIN HCL 500 MG PO TABS: 500.0000 mg | ORAL_TABLET | Freq: Two times a day (BID) | ORAL | 3 refills | Status: DC

## 2018-01-29 NOTE — Assessment & Plan Note (Signed)
Follows with optometry semi-annually.

## 2018-01-29 NOTE — Progress Notes (Signed)
Subjective:    Patient ID: Jac Canavan, female    DOB: 01-19-1966, 52 y.o.   MRN: [id number]  HPI  Ms. Loveall is a 52 year old female who presents today for complete physical.  Immunizations: -Tetanus: Completed in 2018 -Influenza: Did complete this season   Diet: She endorses a healthy diet for which she started 2 years ago. Breakfast: Bagel, cereal  Lunch: Sandwich, fruit Dinner: Fish, chicken, vegetables, salad Snacks: Fruit, occasional chips Desserts: 1-2 times monthly Beverages: Water, coffee, little soda, mildly sweet tea  Exercise: Yoga 1-2 times weekly Eye exam: Completed in January 2019 Dental exam: Completes semi-annually Colonoscopy: Never completed, referral placed to GI last year. Interested in [employer]. Pap Smear: Hysterectomy  Mammogram: Negative in June 2018.   Wt Readings from Last 3 Encounters:  01/29/18 133 lb (60.3 kg)  12/03/17 135 lb 12.8 oz (61.6 kg)  11/19/17 134 lb (60.8 kg)      Review of Systems  Constitutional: Negative for unexpected weight change.  HENT: Negative for rhinorrhea.   Respiratory: Negative for cough and shortness of breath.   Cardiovascular: Negative for chest pain.  Gastrointestinal: Negative for constipation and diarrhea.  Genitourinary: Negative for difficulty urinating and menstrual problem.  Musculoskeletal: Positive for back pain. Negative for myalgias.  Skin: Negative for rash.  Allergic/Immunologic: Negative for environmental allergies.  Neurological: Negative for dizziness, numbness and headaches.  Psychiatric/Behavioral: Negative for sleep disturbance. The patient is not nervous/anxious.        Past Medical History:  Diagnosis Date  . Allergy   . Arthritis   . Cancer ([place])   . Chickenpox   . Glaucoma   . Seizures ([place])      Social History   Socioeconomic History  . Marital status: Married    Spouse name: Not on file  . Number of children: Not on file  . Years of education: Not on file  .  Highest education level: Not on file  Occupational History  . Not on file  Social Needs  . Financial resource strain: Not on file  . Food insecurity:    Worry: Not on file    Inability: Not on file  . Transportation needs:    Medical: Not on file    Non-medical: Not on file  Tobacco Use  . Smoking status: Current Every Day Smoker    Packs/day: 0.50    Types: Cigarettes  . Smokeless tobacco: Never Used  Substance and Sexual Activity  . Alcohol use: Yes    Alcohol/week: 0.0 oz    Comment: rarely  . Drug use: Not on file  . Sexual activity: Not on file  Lifestyle  . Physical activity:    Days per week: Not on file    Minutes per session: Not on file  . Stress: Not on file  Relationships  . Social connections:    Talks on phone: Not on file    Gets together: Not on file    Attends religious service: Not on file    Active member of club or organization: Not on file    Attends meetings of clubs or organizations: Not on file    Relationship status: Not on file  . Intimate partner violence:    Fear of current or ex partner: Not on file    Emotionally abused: Not on file    Physically abused: Not on file    Forced sexual activity: Not on file  Other Topics Concern  . Not on file  Social History Narrative   Married.   2 children.   Works doing Radiographer, therapeutic.   Enjoys going to [employer], fishing, gardening.    Past Surgical History:  Procedure Laterality Date  . ABDOMINAL HYSTERECTOMY  2002  . REDUCTION MAMMAPLASTY Bilateral 20+yrs ago    Family History  Problem Relation Age of Onset  . Arthritis Father   . Lung cancer Father   . Hypertension Sister   . Pancreatic cancer Mother   . Diabetes Mother   . Diabetes Maternal Aunt     Allergies  Allergen Reactions  . Lac Bovis Nausea And Vomiting  . Penicillin G Rash    Current Outpatient Medications on File Prior to Visit  Medication Sig Dispense Refill  . lisinopril (PRINIVIL,ZESTRIL) 10 MG tablet Take  1 tablet (10 mg total) by mouth daily. 90 tablet 3  . traMADol (ULTRAM) 50 MG tablet TAKE 1/2 TO 1 TABLET BY MOUTH EVERY DAY AS NEEDED 90 tablet 0  . zolpidem (AMBIEN) 10 MG tablet TAKE 1 TABLET BY MOUTH AT BEDTIME AS NEEDED SLEEP 90 tablet 0   No current facility-administered medications on file prior to visit.     BP 112/78 (BP Location: Left Arm, Patient Position: Sitting, Cuff Size: Normal)   Pulse 78   Temp 97.8 F (36.6 C) (Oral)   Ht 5' 0.5" (1.537 m)   Wt 133 lb (60.3 kg)   SpO2 98%   BMI 25.55 kg/m    Objective:   Physical Exam  Constitutional: She is oriented to person, place, and time. She appears well-nourished.  HENT:  Right Ear: Tympanic membrane and ear canal normal.  Left Ear: Tympanic membrane and ear canal normal.  Nose: Nose normal.  Mouth/Throat: Oropharynx is clear and moist.  Eyes: Pupils are equal, round, and reactive to light. Conjunctivae and EOM are normal.  Neck: Neck supple. No thyromegaly present.  Cardiovascular: Normal rate and regular rhythm.  No murmur heard. Pulmonary/Chest: Effort normal and breath sounds normal. She has no rales.  Abdominal: Soft. Bowel sounds are normal. There is no tenderness.  Musculoskeletal: Normal range of motion.  Lymphadenopathy:    She has no cervical adenopathy.  Neurological: She is alert and oriented to person, place, and time. She has normal reflexes. No cranial nerve deficit.  Skin: Skin is warm and dry. No rash noted.  Psychiatric: She has a normal mood and affect.          Assessment & Plan:

## 2018-01-29 NOTE — Assessment & Plan Note (Signed)
Managed on Tramadol, continue same. UDS UTD.

## 2018-01-29 NOTE — Assessment & Plan Note (Signed)
Using Tramadol 1/2 to 1 tablet once daily on average. Also with muscle spasms. UDS UTD. Will refer to orthopedics for further evaluation given little improvement.

## 2018-01-29 NOTE — Assessment & Plan Note (Signed)
Immunizations UTD. Mammogram due this Summer, order placed. Colon cancer screening due, she opts for Cologuard and was signed up. Commended her on her overall healthy diet, recommended regular exercise. Exam unremarkable. Labs with new diabetes which was addressed. Follow up in 1 year.

## 2018-01-29 NOTE — Patient Instructions (Addendum)
Start exercising. You should be getting 150 minutes of moderate intensity exercise weekly.  Continue to work on a healthy diet.   Ensure you are consuming 64 ounces of water daily.  Start metformin 500 mg tablets for diabetes. Start by taking 1 tablet once daily for 2 weeks, then increase to 1 tablet twice daily thereafter.  Complete the Cologuard kit as directed.  Schedule your mammogram for this Summer.  Schedule a lab only appointment in 3 months to repeat your A1C.  Please schedule a follow up appointment in 6 months.   It was a pleasure to see you today!   Diabetes Mellitus and Nutrition When you have diabetes (diabetes mellitus), it is very important to have healthy eating habits because your blood sugar (glucose) levels are greatly affected by what you eat and drink. Eating healthy foods in the appropriate amounts, at about the same times every day, can help you:  Control your blood glucose.  Lower your risk of heart disease.  Improve your blood pressure.  Reach or maintain a healthy weight.  Every person with diabetes is different, and each person has different needs for a meal plan. Your health care provider may recommend that you work with a diet and nutrition specialist (dietitian) to make a meal plan that is best for you. Your meal plan may vary depending on factors such as:  The calories you need.  The medicines you take.  Your weight.  Your blood glucose, blood pressure, and cholesterol levels.  Your activity level.  Other health conditions you have, such as heart or kidney disease.  How do carbohydrates affect me? Carbohydrates affect your blood glucose level more than any other type of food. Eating carbohydrates naturally increases the amount of glucose in your blood. Carbohydrate counting is a method for keeping track of how many carbohydrates you eat. Counting carbohydrates is important to keep your blood glucose at a healthy level, especially if you use  insulin or take certain oral diabetes medicines. It is important to know how many carbohydrates you can safely have in each meal. This is different for every person. Your dietitian can help you calculate how many carbohydrates you should have at each meal and for snack. Foods that contain carbohydrates include:  Bread, cereal, rice, pasta, and crackers.  Potatoes and corn.  Peas, beans, and lentils.  Milk and yogurt.  Fruit and juice.  Desserts, such as cakes, cookies, ice cream, and candy.  How does alcohol affect me? Alcohol can cause a sudden decrease in blood glucose (hypoglycemia), especially if you use insulin or take certain oral diabetes medicines. Hypoglycemia can be a life-threatening condition. Symptoms of hypoglycemia (sleepiness, dizziness, and confusion) are similar to symptoms of having too much alcohol. If your health care provider says that alcohol is safe for you, follow these guidelines:  Limit alcohol intake to no more than 1 drink per day for nonpregnant women and 2 drinks per day for men. One drink equals 12 oz of beer, 5 oz of wine, or 1 oz of hard liquor.  Do not drink on an empty stomach.  Keep yourself hydrated with water, diet soda, or unsweetened iced tea.  Keep in mind that regular soda, juice, and other mixers may contain a lot of sugar and must be counted as carbohydrates.  What are tips for following this plan? Reading food labels  Start by checking the serving size on the label. The amount of calories, carbohydrates, fats, and other nutrients listed on the label  are based on one serving of the food. Many foods contain more than one serving per package.  Check the total grams (g) of carbohydrates in one serving. You can calculate the number of servings of carbohydrates in one serving by dividing the total carbohydrates by 15. For example, if a food has 30 g of total carbohydrates, it would be equal to 2 servings of carbohydrates.  Check the number  of grams (g) of saturated and trans fats in one serving. Choose foods that have low or no amount of these fats.  Check the number of milligrams (mg) of sodium in one serving. Most people should limit total sodium intake to less than 2,300 mg per day.  Always check the nutrition information of foods labeled as "low-fat" or "nonfat". These foods may be higher in added sugar or refined carbohydrates and should be avoided.  Talk to your dietitian to identify your daily goals for nutrients listed on the label. Shopping  Avoid buying canned, premade, or processed foods. These foods tend to be high in fat, sodium, and added sugar.  Shop around the outside edge of the grocery store. This includes fresh fruits and vegetables, bulk grains, fresh meats, and fresh dairy. Cooking  Use low-heat cooking methods, such as baking, instead of high-heat cooking methods like deep frying.  Cook using healthy oils, such as olive, canola, or sunflower oil.  Avoid cooking with butter, cream, or high-fat meats. Meal planning  Eat meals and snacks regularly, preferably at the same times every day. Avoid going long periods of time without eating.  Eat foods high in fiber, such as fresh fruits, vegetables, beans, and whole grains. Talk to your dietitian about how many servings of carbohydrates you can eat at each meal.  Eat 4-6 ounces of lean protein each day, such as lean meat, chicken, fish, eggs, or tofu. 1 ounce is equal to 1 ounce of meat, chicken, or fish, 1 egg, or 1/4 cup of tofu.  Eat some foods each day that contain healthy fats, such as avocado, nuts, seeds, and fish. Lifestyle   Check your blood glucose regularly.  Exercise at least 30 minutes 5 or more days each week, or as told by your health care provider.  Take medicines as told by your health care provider.  Do not use any products that contain nicotine or tobacco, such as cigarettes and e-cigarettes. If you need help quitting, ask your  health care provider.  Work with a Social worker or diabetes educator to identify strategies to manage stress and any emotional and social challenges. What are some questions to ask my health care provider?  Do I need to meet with a diabetes educator?  Do I need to meet with a dietitian?  What number can I call if I have questions?  When are the best times to check my blood glucose? Where to find more information:  American Diabetes Association: diabetes.org/food-and-fitness/food  Academy of Nutrition and Dietetics: PokerClues.dk  [employer] of Diabetes and Digestive and Kidney Diseases (NIH): ContactWire.be Summary  A healthy meal plan will help you control your blood glucose and maintain a healthy lifestyle.  Working with a diet and nutrition specialist (dietitian) can help you make a meal plan that is best for you.  Keep in mind that carbohydrates and alcohol have immediate effects on your blood glucose levels. It is important to count carbohydrates and to use alcohol carefully. This information is not intended to replace advice given to you by your health care provider.  Make sure you discuss any questions you have with your health care provider. Document Released: 07/24/2005 Document Revised: 12/01/2016 Document Reviewed: 12/01/2016 Elsevier Interactive Patient Education  [employer].

## 2018-01-29 NOTE — Assessment & Plan Note (Signed)
Recent diagnosis of 6.5.  Rx for Metformin 500 mg BID course sent to pharmacy, will start with one daily x 2 weeks, then increase up to BID thereafter. Managed on ACE. LDL borderline, she would like to work on exercise. Food exam completed today.   Repeat A1C in 3 months.

## 2018-01-29 NOTE — Assessment & Plan Note (Signed)
Doing well on Ambien, UDS UTD.  Continue same.

## 2018-01-29 NOTE — Assessment & Plan Note (Signed)
Stable in the office today, continue lisinopril 10 mg. BMP unremarkable.  

## 2018-02-02 ENCOUNTER — Telehealth: Payer: Self-pay

## 2018-02-02 NOTE — Telephone Encounter (Signed)
Left message for patient to call Shanley Furlough back in regards to a referral-Tarry Blayney V [place]pe, RMA   

## 2018-02-16 ENCOUNTER — Other Ambulatory Visit: Payer: Self-pay | Admitting: Primary Care

## 2018-02-16 DIAGNOSIS — M1990 Unspecified osteoarthritis, unspecified site: Secondary | ICD-10-CM

## 2018-02-17 NOTE — Telephone Encounter (Signed)
No suspicious activity noted on PMP Aware. UDS is up to date. Refill sent to pharmacy.

## 2018-02-17 NOTE — Telephone Encounter (Signed)
Ok to refill? Electronically refill request for traMADol (ULTRAM) 50 MG tablet  Last prescribed on 10/28/2017. Last seen on 01/29/2018

## 2018-03-01 ENCOUNTER — Encounter: Payer: Self-pay | Admitting: Primary Care

## 2018-03-01 DIAGNOSIS — Z1211 Encounter for screening for malignant neoplasm of colon: Secondary | ICD-10-CM | POA: Diagnosis not present

## 2018-03-01 DIAGNOSIS — Z1212 Encounter for screening for malignant neoplasm of rectum: Secondary | ICD-10-CM | POA: Diagnosis not present

## 2018-03-04 DIAGNOSIS — M545 Low back pain: Secondary | ICD-10-CM | POA: Diagnosis not present

## 2018-03-11 LAB — COLOGUARD

## 2018-03-12 ENCOUNTER — Telehealth: Payer: Self-pay | Admitting: Primary Care

## 2018-03-12 DIAGNOSIS — Z1211 Encounter for screening for malignant neoplasm of colon: Secondary | ICD-10-CM

## 2018-03-12 NOTE — Telephone Encounter (Signed)
Please notify patient that her Cologuard specimen was positive. I recommend she proceed with colonoscopy. Is she agreeable?

## 2018-03-15 NOTE — Telephone Encounter (Signed)
Per DPR, left detail message of Kate Clark's comments for patient to call back 

## 2018-03-15 NOTE — Telephone Encounter (Signed)
Pt returned call

## 2018-03-16 NOTE — Telephone Encounter (Signed)
Spoken and notified patient of Monica Daniel comments. Patient stated that she is agreeable and prefer a GI in [place].  Also patient wanted [name] to know that patient was very unable going to [facility] for her back pain, the doctor that she saw was rude. Patient wants to go somewhere but she wants to search around first then will let [name] know.

## 2018-03-16 NOTE — Telephone Encounter (Signed)
Noted, referral to GI placed.

## 2018-03-23 ENCOUNTER — Encounter: Payer: Self-pay | Admitting: Gastroenterology

## 2018-03-29 ENCOUNTER — Encounter: Payer: Self-pay | Admitting: Primary Care

## 2018-03-29 DIAGNOSIS — E1165 Type 2 diabetes mellitus with hyperglycemia: Secondary | ICD-10-CM

## 2018-03-30 MED ORDER — METFORMIN HCL ER 500 MG PO TB24: 500.0000 mg | ORAL_TABLET | Freq: Every day | ORAL | 1 refills | Status: DC

## 2018-04-29 ENCOUNTER — Encounter: Payer: Self-pay | Admitting: *Deleted

## 2018-05-03 ENCOUNTER — Other Ambulatory Visit: Payer: Self-pay | Admitting: Primary Care

## 2018-05-03 DIAGNOSIS — G4700 Insomnia, unspecified: Secondary | ICD-10-CM

## 2018-05-04 NOTE — Telephone Encounter (Signed)
Noted, UDS is UTD, no suspicious activity on PMP aware.  Refill sent to pharmacy.

## 2018-05-04 NOTE — Telephone Encounter (Signed)
Name of Medication: zolpidem (AMBIEN) 10 MG tablet  Name of Pharmacy: [facility] [address]  Last Fill or Written Date and Quantity: 01/13/2018 #90  Last Office Visit and Type: 01/29/2018 CPE  Next Office Visit and Type: Follow up on 08/05/2018  Last Controlled Substance Agreement Date: 09/24/2017  Last UDS: 09/24/2017

## 2018-05-06 ENCOUNTER — Other Ambulatory Visit: Payer: 59

## 2018-05-10 ENCOUNTER — Ambulatory Visit
Admission: RE | Admit: 2018-05-10 | Discharge: 2018-05-10 | Disposition: A | Discharge status: Home / Self Care | Payer: 59 | Source: Ambulatory Visit | Attending: Primary Care | Admitting: Primary Care

## 2018-05-10 ENCOUNTER — Other Ambulatory Visit (INDEPENDENT_AMBULATORY_CARE_PROVIDER_SITE_OTHER): Payer: 59

## 2018-05-10 DIAGNOSIS — Z1231 Encounter for screening mammogram for malignant neoplasm of breast: Secondary | ICD-10-CM | POA: Insufficient documentation

## 2018-05-10 DIAGNOSIS — E119 Type 2 diabetes mellitus without complications: Secondary | ICD-10-CM | POA: Diagnosis not present

## 2018-05-10 DIAGNOSIS — Z1239 Encounter for other screening for malignant neoplasm of breast: Secondary | ICD-10-CM

## 2018-05-10 LAB — HEMOGLOBIN A1C: Hgb A1c MFr Bld: 6.2 % (ref 4.6–6.5)

## 2018-05-11 ENCOUNTER — Ambulatory Visit (AMBULATORY_SURGERY_CENTER): Payer: Self-pay

## 2018-05-11 VITALS — Ht 60.0 in | Wt 128.4 lb

## 2018-05-11 DIAGNOSIS — R195 Other fecal abnormalities: Secondary | ICD-10-CM

## 2018-05-11 MED ORDER — NA SULFATE-K SULFATE-MG SULF 17.5-3.13-1.6 GM/177ML PO SOLN: 1.0000 | Freq: Once | ORAL | 0 refills | Status: AC

## 2018-05-11 NOTE — Progress Notes (Signed)
Per pt, no allergies to soy or egg products.Pt not taking any weight loss meds or using  O2 at home.  Pt refused emmi video. 

## 2018-05-21 ENCOUNTER — Encounter: Payer: Self-pay | Admitting: Gastroenterology

## 2018-05-25 ENCOUNTER — Telehealth: Payer: Self-pay | Admitting: Gastroenterology

## 2018-05-25 NOTE — Telephone Encounter (Signed)
A user error has taken place.

## 2018-05-27 ENCOUNTER — Other Ambulatory Visit: Payer: Self-pay

## 2018-05-27 ENCOUNTER — Ambulatory Visit (AMBULATORY_SURGERY_CENTER): Payer: 59 | Admitting: Gastroenterology

## 2018-05-27 ENCOUNTER — Encounter: Payer: Self-pay | Admitting: Gastroenterology

## 2018-05-27 VITALS — BP 95/55 | HR 67 | Temp 98.6°F | Resp 14 | Ht 60.0 in | Wt 128.0 lb

## 2018-05-27 DIAGNOSIS — Z1211 Encounter for screening for malignant neoplasm of colon: Secondary | ICD-10-CM | POA: Diagnosis not present

## 2018-05-27 DIAGNOSIS — R195 Other fecal abnormalities: Secondary | ICD-10-CM | POA: Diagnosis present

## 2018-05-27 DIAGNOSIS — D122 Benign neoplasm of ascending colon: Secondary | ICD-10-CM | POA: Diagnosis not present

## 2018-05-27 DIAGNOSIS — D120 Benign neoplasm of cecum: Secondary | ICD-10-CM | POA: Diagnosis not present

## 2018-05-27 MED ORDER — SODIUM CHLORIDE 0.9 % IV SOLN: 500.0000 mL | Freq: Once | INTRAVENOUS | Status: DC

## 2018-05-27 NOTE — Patient Instructions (Signed)
*   handout on polyps and hemorrhoids*  YOU HAD AN ENDOSCOPIC PROCEDURE TODAY AT [facility]:   Refer to the procedure report that was given to you for any specific questions about what was found during the examination.  If the procedure report does not answer your questions, please call your gastroenterologist to clarify.  If you requested that your care partner not be given the details of your procedure findings, then the procedure report has been included in a sealed envelope for you to review at your convenience later.  YOU SHOULD EXPECT: Some feelings of bloating in the abdomen. Passage of more gas than usual.  Walking can help get rid of the air that was put into your GI tract during the procedure and reduce the bloating. If you had a lower endoscopy (such as a colonoscopy or flexible sigmoidoscopy) you may notice spotting of blood in your stool or on the toilet paper. If you underwent a bowel prep for your procedure, you may not have a normal bowel movement for a few days.  Please Note:  You might notice some irritation and congestion in your nose or some drainage.  This is from the oxygen used during your procedure.  There is no need for concern and it should clear up in a day or so.  SYMPTOMS TO REPORT IMMEDIATELY:   Following lower endoscopy (colonoscopy or flexible sigmoidoscopy):  Excessive amounts of blood in the stool  Significant tenderness or worsening of abdominal pains  Swelling of the abdomen that is new, acute  Fever of 100F or higher   For urgent or emergent issues, a gastroenterologist can be reached at any hour by calling [phone].   DIET:  We do recommend a small meal at first, but then you may proceed to your regular diet.  Drink plenty of fluids but you should avoid alcoholic beverages for 24 hours.  ACTIVITY:  You should plan to take it easy for the rest of today and you should NOT DRIVE or use heavy machinery until tomorrow (because of the  sedation medicines used during the test).    FOLLOW UP: Our staff will call the number listed on your records the next business day following your procedure to check on you and address any questions or concerns that you may have regarding the information given to you following your procedure. If we do not reach you, we will leave a message.  However, if you are feeling well and you are not experiencing any problems, there is no need to return our call.  We will assume that you have returned to your regular daily activities without incident.  If any biopsies were taken you will be contacted by phone or by letter within the next 1-3 weeks.  Please call us at [phone] if you have not heard about the biopsies in 3 weeks.    SIGNATURES/CONFIDENTIALITY: You and/or your care partner have signed paperwork which will be entered into your electronic medical record.  These signatures attest to the fact that that the information above on your After Visit Summary has been reviewed and is understood.  Full responsibility of the confidentiality of this discharge information lies with you and/or your care-partner.

## 2018-05-27 NOTE — Op Note (Signed)
[facility] Patient Name: Monica Daniel Procedure Date: 05/27/2018 10:33 AM MRN: [id number] Endoscopist: Ladene Artist , MD Age: 52 Referring MD:  Date of Birth: 12-28-1965 Gender: Female Account #: [id number] Procedure:                Colonoscopy Indications:              Positive Cologuard test Medicines:                Monitored Anesthesia Care Procedure:                Pre-Anesthesia Assessment:                           - Prior to the procedure, a History and Physical                            was performed, and patient medications and                            allergies were reviewed. The patient's tolerance of                            previous anesthesia was also reviewed. The risks                            and benefits of the procedure and the sedation                            options and risks were discussed with the patient.                            All questions were answered, and informed consent                            was obtained. Prior Anticoagulants: The patient has                            taken no previous anticoagulant or antiplatelet                            agents. ASA Grade Assessment: II - A patient with                            mild systemic disease. After reviewing the risks                            and benefits, the patient was deemed in                            satisfactory condition to undergo the procedure.                           After obtaining informed consent, the colonoscope  was passed under direct vision. Throughout the                            procedure, the patient's blood pressure, pulse, and                            oxygen saturations were monitored continuously. The                            Colonoscope was introduced through the anus and                            advanced to the the cecum, identified by                            appendiceal orifice and ileocecal valve.  The                            ileocecal valve, appendiceal orifice, and rectum                            were photographed. The quality of the bowel                            preparation was good. The patient tolerated the                            procedure well. The colonoscopy was somewhat                            difficult due to restricted mobility of the sigmoid                            colon. Successful completion of the procedure was                            aided by withdrawing the adult colonoscope and                            replacing with the pediatric colonoscope. Scope In: 10:45:48 AM Scope Out: 11:04:17 AM Scope Withdrawal Time: 0 hours 12 minutes 47 seconds  Total Procedure Duration: 0 hours 18 minutes 29 seconds  Findings:                 The perianal and digital rectal examinations were                            normal.                           A 8 mm polyp was found in the ascending colon. The                            polyp was sessile. The polyp was removed with a  cold snare. Resection and retrieval were complete.                           A 5 mm polyp was found in the cecum. The polyp was                            sessile. The polyp was removed with a cold biopsy                            forceps. Resection and retrieval were complete.                           Internal hemorrhoids were found during                            retroflexion. The hemorrhoids were small and Grade                            I (internal hemorrhoids that do not prolapse).                           The exam was otherwise without abnormality on                            direct and retroflexion views. Complications:            No immediate complications. Estimated blood loss:                            None. Estimated Blood Loss:     Estimated blood loss: none. Impression:               - One 8 mm polyp in the ascending colon, removed                             with a cold snare. Resected and retrieved.                           - One 5 mm polyp in the cecum, removed with a cold                            biopsy forceps. Resected and retrieved.                           - Internal hemorrhoids.                           - The examination was otherwise normal on direct                            and retroflexion views. Recommendation:           - Repeat colonoscopy in 5 years for surveillance if  polyp(s) are precancerous, otherwise 10 years.                           - Patient has a contact number available for                            emergencies. The signs and symptoms of potential                            delayed complications were discussed with the                            patient. Return to normal activities tomorrow.                            Written discharge instructions were provided to the                            patient.                           - Resume previous diet.                           - Continue present medications.                           - Await pathology results. Ladene Artist, MD 05/27/2018 11:08:04 AM This report has been signed electronically.

## 2018-05-27 NOTE — Progress Notes (Signed)
Called to room to assist during endoscopic procedure.  Patient ID and intended procedure confirmed with present staff. Received instructions for my participation in the procedure from the performing physician.  

## 2018-05-27 NOTE — Progress Notes (Signed)
To PACU, VSS. Report to RN.tb 

## 2018-05-27 NOTE — Progress Notes (Signed)
Pt's states no medical or surgical changes since previsit or office visit. 

## 2018-05-31 ENCOUNTER — Telehealth: Payer: Self-pay

## 2018-05-31 ENCOUNTER — Telehealth: Payer: Self-pay | Admitting: *Deleted

## 2018-05-31 NOTE — Telephone Encounter (Signed)
  Follow up Call-  Call back number 05/27/2018  Post procedure Call Back phone  # [phone]  Permission to leave phone message Yes  Some recent data might be hidden     Patient questions:  Do you have a fever, pain , or abdominal swelling? No. Pain Score  0 *  Have you tolerated food without any problems? Yes.    Have you been able to return to your normal activities? Yes.    Do you have any questions about your discharge instructions: Diet   No. Medications  No. Follow up visit  No.  Do you have questions or concerns about your Care? No.  Actions: * If pain score is 4 or above: No action needed, pain <4.

## 2018-05-31 NOTE — Telephone Encounter (Signed)
  Follow up Call-  Call back number 05/27/2018  Post procedure Call Back phone  # [phone]  Permission to leave phone message Yes  Some recent data might be hidden     No answer at # given.  LM on VM.

## 2018-06-03 ENCOUNTER — Encounter: Payer: Self-pay | Admitting: Gastroenterology

## 2018-07-12 ENCOUNTER — Other Ambulatory Visit: Payer: Self-pay | Admitting: Primary Care

## 2018-07-12 DIAGNOSIS — M1990 Unspecified osteoarthritis, unspecified site: Secondary | ICD-10-CM

## 2018-07-14 NOTE — Telephone Encounter (Signed)
Name of Medication: traMADol (ULTRAM) 50 MG tablet  Name of [facility]  Last Fill or Written Date and Quantity: 02/17/2018 #90   Last Office Visit and Type: 01/29/2018 CPE  Next Office Visit and Type: 08/05/2018 Follow up  Last Controlled Substance Agreement Date: 09/24/2017  Last UDS:09/24/2017

## 2018-07-14 NOTE — Telephone Encounter (Signed)
Noted, PMP aware site without suspicious activity. Refill sent to pharmacy. UDS is due in November 2019. Raquel Sarna, will you please schedule patient a lab only appointment for November to have her UDS? She will need this in order to continue Tramadol.

## 2018-08-05 ENCOUNTER — Encounter: Payer: Self-pay | Admitting: Primary Care

## 2018-08-05 ENCOUNTER — Other Ambulatory Visit: Payer: Self-pay | Admitting: Primary Care

## 2018-08-05 ENCOUNTER — Ambulatory Visit: Payer: 59 | Admitting: Primary Care

## 2018-08-05 VITALS — BP 130/86 | HR 71 | Temp 98.0°F | Ht 60.0 in | Wt 129.0 lb

## 2018-08-05 DIAGNOSIS — E785 Hyperlipidemia, unspecified: Secondary | ICD-10-CM

## 2018-08-05 DIAGNOSIS — Z23 Encounter for immunization: Secondary | ICD-10-CM

## 2018-08-05 DIAGNOSIS — E119 Type 2 diabetes mellitus without complications: Secondary | ICD-10-CM

## 2018-08-05 DIAGNOSIS — M1990 Unspecified osteoarthritis, unspecified site: Secondary | ICD-10-CM | POA: Diagnosis not present

## 2018-08-05 DIAGNOSIS — M545 Low back pain, unspecified: Secondary | ICD-10-CM

## 2018-08-05 DIAGNOSIS — G8929 Other chronic pain: Secondary | ICD-10-CM

## 2018-08-05 DIAGNOSIS — Z79899 Other long term (current) drug therapy: Secondary | ICD-10-CM

## 2018-08-05 LAB — LIPID PANEL
Cholesterol: 190 mg/dL (ref 0–200)
HDL: 44.5 mg/dL (ref >39.00)
LDL CALC: 117 mg/dL — AB (ref 0–99)
NonHDL: 145.67
Total CHOL/HDL Ratio: 4
Triglycerides: 143 mg/dL (ref 0.0–149.0)
VLDL: 28.6 mg/dL (ref 0.0–40.0)

## 2018-08-05 LAB — HEMOGLOBIN A1C: Hgb A1c MFr Bld: 6.2 % (ref 4.6–6.5)

## 2018-08-05 MED ORDER — GLIPIZIDE ER 5 MG PO TB24: 5.0000 mg | ORAL_TABLET | Freq: Every day | ORAL | 3 refills | Status: DC

## 2018-08-05 NOTE — Addendum Note (Signed)
Addended by: Jacqualin Combes on: 08/05/2018 07:59 AM   Modules accepted: Orders

## 2018-08-05 NOTE — Patient Instructions (Addendum)
Stop by the lab prior to leaving today. I will notify you of your results once received.   Continue to work on [employer], continue with exercise.  We'll see you in 6 months for your annual physical or sooner if needed.  It was a pleasure to see you today!

## 2018-08-05 NOTE — Assessment & Plan Note (Signed)
Repeat A1C pending.  Metformin ER causing GI upset, will change to Glipizide but will wait for A1C result regarding dose.  Pneumonia vaccination due today. Eye exam is scheduled for early October. Managed on ACE.  Lipid panel pending, discussed LDL goal of <100.  Follow up in 6 months.

## 2018-08-05 NOTE — Assessment & Plan Note (Signed)
UDS pending today. Continue current regimen.

## 2018-08-05 NOTE — Progress Notes (Signed)
Subjective:    Patient ID: Jac Canavan, female    DOB: [birth year], 52 y.o.   MRN: [id number]  HPI  Ms. Tyson is a 52 year old female who presents today for follow up. She is also due for urine drug screen for Tramadol use.   1) Type 2 Diabetes:  Current medications include: Metformin XR 500 mg once daily. She's experiencing side effects of upset stomach, gas. She denies diarrhea and is having frequent bowel movements.   She is checking her blood glucose 1-2 times weekly and is getting readings of: AM fasting: 95-110  Last A1C: 6.2 in July 2019, 6.5 in March 2019 Last Eye Exam: Due in two weeks Last Foot Exam: Due in March 2020 Pneumonia Vaccination: Due today ACE/ARB: lisinopril Statin: None. LDL of 108 in March 2019  Diet currently consists of:  Breakfast: Cereal, bagel  Lunch: Sandwich, fruit Dinner: Chicken, some red mean, fish, vegetables, salad, some starch Snacks: Nuts Desserts: Rarely  Beverages: Water, half sweet/un-sweet tea, coffee, some soda  Exercise: She is doing yoga and is walking   The 10-year ASCVD risk score Mikey Bussing DC Jr., et al., 2013) is: 6.2%   Values used to calculate the score:     Age: 49 years     Sex: Female     Is Non-Hispanic African American: No     Diabetic: Yes     Tobacco smoker: Yes     Systolic Blood Pressure: 95 mmHg     Is BP treated: Yes     HDL Cholesterol: 39.9 mg/dL     Total Cholesterol: 167 mg/dL     Review of Systems  Eyes: Negative for visual disturbance.  Respiratory: Negative for shortness of breath.   Cardiovascular: Negative for chest pain.  Neurological: Negative for dizziness and numbness.       Past Medical History:  Diagnosis Date  . Allergy   . Arthritis   . Cancer ([place]) 2011   basal cell on lip  . Chickenpox   . Glaucoma    no meds  . Hyperlipidemia   . Seizures ([place])    as a child/ last one 107-12 years old     Social History   Socioeconomic History  . Marital status: Married   Spouse name: Not on file  . Number of children: Not on file  . Years of education: Not on file  . Highest education level: Not on file  Occupational History  . Not on file  Social Needs  . Financial resource strain: Not on file  . Food insecurity:    Worry: Not on file    Inability: Not on file  . Transportation needs:    Medical: Not on file    Non-medical: Not on file  Tobacco Use  . Smoking status: Current Every Day Smoker    Packs/day: 0.50    Types: Cigarettes  . Smokeless tobacco: Never Used  Substance and Sexual Activity  . Alcohol use: Yes    Alcohol/week: 0.0 standard drinks    Comment: rarely  . Drug use: Not on file  . Sexual activity: Not on file  Lifestyle  . Physical activity:    Days per week: Not on file    Minutes per session: Not on file  . Stress: Not on file  Relationships  . Social connections:    Talks on phone: Not on file    Gets together: Not on file    Attends religious service: Not on file  Active member of club or organization: Not on file    Attends meetings of clubs or organizations: Not on file    Relationship status: Not on file  . Intimate partner violence:    Fear of current or ex partner: Not on file    Emotionally abused: Not on file    Physically abused: Not on file    Forced sexual activity: Not on file  Other Topics Concern  . Not on file  Social History Narrative   Married.   2 children.   Works doing Radiographer, therapeutic.   Enjoys going to [employer], fishing, gardening.    Past Surgical History:  Procedure Laterality Date  . ABDOMINAL HYSTERECTOMY  2002   per pt was vaginal hysterectomy  . [place]   left  . REDUCTION MAMMAPLASTY Bilateral 20+yrs ago   cut under armpit are/ not full reduction  . TUBAL LIGATION  1991    Family History  Problem Relation Age of Onset  . Arthritis Father   . Lung cancer Father   . Hypertension Sister   . Pancreatic cancer Mother   . Diabetes Mother   .  Diabetes Maternal Aunt     Allergies  Allergen Reactions  . Lac Bovis Nausea And Vomiting  . Penicillin G Rash    Current Outpatient Medications on File Prior to Visit  Medication Sig Dispense Refill  . lisinopril (PRINIVIL,ZESTRIL) 10 MG tablet Take 1 tablet (10 mg total) by mouth daily. 90 tablet 3  . metFORMIN (GLUCOPHAGE-XR) 500 MG 24 hr tablet Take 1 tablet (500 mg total) by mouth daily with breakfast. 90 tablet 1  . Omega-3 Fatty Acids (FISH OIL PO) Take 500 mg by mouth daily.    . traMADol (ULTRAM) 50 MG tablet TAKE 1/2 TO 1 TABLET BY MOUTH ONCE A DAY AS NEEDED 90 tablet 0  . vitamin B-12 (CYANOCOBALAMIN) 250 MCG tablet Take 250 mcg by mouth daily.    . vitamin E 400 UNIT capsule Take 400 Units by mouth daily.    Marland Kitchen zolpidem (AMBIEN) 10 MG tablet TAKE 1 TABLET BY MOUTH AT BEDTIME AS NEEDED SLEEP 90 tablet 0   Current Facility-Administered Medications on File Prior to Visit  Medication Dose Route Frequency Provider Last Rate Last Dose  . 0.9 %  sodium chloride infusion  500 mL Intravenous Once Ladene Artist, MD        BP 130/86   Pulse 71   Temp 98 F (36.7 C) (Oral)   Ht 5' (1.524 m)   Wt 129 lb (58.5 kg)   SpO2 99%   BMI 25.19 kg/m    Objective:   Physical Exam  Constitutional: She appears well-nourished.  Neck: Neck supple.  Cardiovascular: Normal rate and regular rhythm.  Respiratory: Effort normal and breath sounds normal.  Skin: Skin is warm and dry.           Assessment & Plan:

## 2018-08-05 NOTE — Assessment & Plan Note (Signed)
UDS due today. Continue current regimen.

## 2018-08-06 LAB — PAIN MGMT, PROFILE 8 W/CONF, U
6 Acetylmorphine: NEGATIVE ng/mL (ref <10)
ALCOHOL METABOLITES: NEGATIVE ng/mL (ref <500)
AMPHETAMINES: NEGATIVE ng/mL (ref <500)
BENZODIAZEPINES: NEGATIVE ng/mL (ref <100)
Buprenorphine, Urine: NEGATIVE ng/mL (ref <5)
COCAINE METABOLITE: NEGATIVE ng/mL (ref <150)
CREATININE: 11.8 mg/dL — AB
MARIJUANA METABOLITE: NEGATIVE ng/mL (ref <20)
MDMA: NEGATIVE ng/mL (ref <500)
Opiates: NEGATIVE ng/mL (ref <100)
Oxidant: NEGATIVE ug/mL (ref <200)
Oxycodone: NEGATIVE ng/mL (ref <100)
Specific Gravity: 1.003 (ref >=1.0)
pH: 6.67 (ref 4.5–9.0)

## 2018-08-06 MED ORDER — ROSUVASTATIN CALCIUM 5 MG PO TABS: ORAL_TABLET | ORAL | 1 refills | Status: DC

## 2018-08-15 ENCOUNTER — Other Ambulatory Visit: Payer: Self-pay | Admitting: Primary Care

## 2018-08-15 DIAGNOSIS — G4700 Insomnia, unspecified: Secondary | ICD-10-CM

## 2018-08-16 NOTE — Telephone Encounter (Signed)
Name of Medication: zolpidem (AMBIEN) 10 MG tablet  [facility]  Last Fill or Written Date and Quantity: 05/04/2018 #90  Last Office Visit and Type: 08/05/2018 follow up  Next Office Visit and Type:   Last Controlled Substance Agreement Date: 08/05/2018  Last UDS: 08/05/2018

## 2018-09-16 ENCOUNTER — Other Ambulatory Visit (INDEPENDENT_AMBULATORY_CARE_PROVIDER_SITE_OTHER): Payer: 59

## 2018-09-16 DIAGNOSIS — E785 Hyperlipidemia, unspecified: Secondary | ICD-10-CM | POA: Diagnosis not present

## 2018-09-16 LAB — HEPATIC FUNCTION PANEL
ALT: 18 U/L (ref 0–35)
AST: 16 U/L (ref 0–37)
Albumin: 4.6 g/dL (ref 3.5–5.2)
Alkaline Phosphatase: 73 U/L (ref 39–117)
BILIRUBIN DIRECT: 0.1 mg/dL (ref 0.0–0.3)
TOTAL PROTEIN: 7 g/dL (ref 6.0–8.3)
Total Bilirubin: 0.4 mg/dL (ref 0.2–1.2)

## 2018-09-16 LAB — LIPID PANEL
CHOL/HDL RATIO: 2
Cholesterol: 108 mg/dL (ref 0–200)
HDL: 44.1 mg/dL (ref >39.00)
LDL Cholesterol: 39 mg/dL (ref 0–99)
NonHDL: 64.1
TRIGLYCERIDES: 126 mg/dL (ref 0.0–149.0)
VLDL: 25.2 mg/dL (ref 0.0–40.0)

## 2018-09-19 ENCOUNTER — Other Ambulatory Visit: Payer: Self-pay | Admitting: Primary Care

## 2018-09-19 DIAGNOSIS — E1165 Type 2 diabetes mellitus with hyperglycemia: Secondary | ICD-10-CM

## 2018-09-30 ENCOUNTER — Other Ambulatory Visit: Payer: Self-pay | Admitting: Internal Medicine

## 2018-09-30 DIAGNOSIS — G4700 Insomnia, unspecified: Secondary | ICD-10-CM

## 2018-10-01 NOTE — Telephone Encounter (Signed)
Noted, refill sent to pharmacy. 

## 2018-10-01 NOTE — Telephone Encounter (Signed)
Name of Medication:zolpidem (AMBIEN) 10 MG tablet  [facility]  Last Fill or Written Date and Quantity:08/16/2018 #30  Last Office Visit and Type:08/05/2018 follow up  Next Office Visit and Type:  Last Controlled Substance Agreement Date:08/05/2018  Last UDS:08/05/2018

## 2018-11-15 ENCOUNTER — Other Ambulatory Visit: Payer: Self-pay | Admitting: Primary Care

## 2018-11-15 DIAGNOSIS — M1990 Unspecified osteoarthritis, unspecified site: Secondary | ICD-10-CM

## 2018-11-16 NOTE — Telephone Encounter (Signed)
Noted.  Refill sent to pharmacy. 

## 2018-11-16 NOTE — Telephone Encounter (Signed)
Name of Medication: traMADol (ULTRAM) 50 MG tablet  [facility]  Last Fill or Written Date and Quantity:  07/14/2018 #90   Last Office Visit and Type: 08/05/2018 Follow up  Next Office Visit and Type:   Last Controlled Substance Agreement Date: 08/05/2018  Last UDS:09/24/2017

## 2018-12-05 ENCOUNTER — Other Ambulatory Visit: Payer: Self-pay | Admitting: Primary Care

## 2018-12-05 DIAGNOSIS — I10 Essential (primary) hypertension: Secondary | ICD-10-CM

## 2018-12-29 ENCOUNTER — Encounter: Payer: Self-pay | Admitting: Primary Care

## 2018-12-29 ENCOUNTER — Ambulatory Visit: Payer: 59 | Admitting: Primary Care

## 2018-12-29 VITALS — BP 122/82 | HR 81 | Temp 98.1°F | Ht 60.0 in | Wt 132.8 lb

## 2018-12-29 DIAGNOSIS — M270 Developmental disorders of jaws: Secondary | ICD-10-CM | POA: Insufficient documentation

## 2018-12-29 DIAGNOSIS — J019 Acute sinusitis, unspecified: Secondary | ICD-10-CM | POA: Diagnosis not present

## 2018-12-29 MED ORDER — AZITHROMYCIN 250 MG PO TABS: ORAL_TABLET | ORAL | 0 refills | Status: DC

## 2018-12-29 NOTE — Assessment & Plan Note (Signed)
Diagnosed at birth, stable since.

## 2018-12-29 NOTE — Patient Instructions (Signed)
Start Azithromycin antibiotics for infection. Take 2 tablets by mouth today, then 1 tablet daily for 4 additional days.  Continue with neti pot rinses, saline nasal spray.  Resume your rosuvastatin as discussed.  It was a pleasure to see you today!

## 2018-12-29 NOTE — Progress Notes (Signed)
Subjective:    Patient ID: Monica Daniel, female    DOB: 08/25/1966, 53 y.o.   MRN: [id number]  HPI  Monica Daniel is a 53 year old female with a history of tobacco abuse, type 2 diabetes, hypertension who presents today with a chief complaint of sinus pressure.  She also reports headaches, ear pain, sore throat, post nasal drip. Symptoms began about two weeks ago, three days ago started feeling worse with increase in headaches. She's blowing cloudy mucous from her cavity. She's used [employer], Saline nasal spray, Tylenol with temporary improvement.   Review of Systems  Constitutional: Negative for fever.  HENT: Positive for congestion, ear pain, sinus pressure and sinus pain. Negative for sore throat.   Respiratory: Positive for cough.        Past Medical History:  Diagnosis Date  . Allergy   . Arthritis   . Cancer ([place]) 2011   basal cell on lip  . Chickenpox   . Glaucoma    no meds  . Hyperlipidemia   . Seizures ([place])    as a child/ last one 64-61 years old     Social History   Socioeconomic History  . Marital status: Married    Spouse name: Not on file  . Number of children: Not on file  . Years of education: Not on file  . Highest education level: Not on file  Occupational History  . Not on file  Social Needs  . Financial resource strain: Not on file  . Food insecurity:    Worry: Not on file    Inability: Not on file  . Transportation needs:    Medical: Not on file    Non-medical: Not on file  Tobacco Use  . Smoking status: Current Every Day Smoker    Packs/day: 0.50    Types: Cigarettes  . Smokeless tobacco: Never Used  Substance and Sexual Activity  . Alcohol use: Yes    Alcohol/week: 0.0 standard drinks    Comment: rarely  . Drug use: Not on file  . Sexual activity: Not on file  Lifestyle  . Physical activity:    Days per week: Not on file    Minutes per session: Not on file  . Stress: Not on file  Relationships  . Social connections:   Talks on phone: Not on file    Gets together: Not on file    Attends religious service: Not on file    Active member of club or organization: Not on file    Attends meetings of clubs or organizations: Not on file    Relationship status: Not on file  . Intimate partner violence:    Fear of current or ex partner: Not on file    Emotionally abused: Not on file    Physically abused: Not on file    Forced sexual activity: Not on file  Other Topics Concern  . Not on file  Social History Narrative   Married.   2 children.   Works doing Radiographer, therapeutic.   Enjoys going to [employer], fishing, gardening.    Past Surgical History:  Procedure Laterality Date  . ABDOMINAL HYSTERECTOMY  2002   per pt was vaginal hysterectomy  . [employer]   left  . REDUCTION MAMMAPLASTY Bilateral 20+yrs ago   cut under armpit are/ not full reduction  . TUBAL LIGATION  1991    Family History  Problem Relation Age of Onset  . Arthritis  Father   . Lung cancer Father   . Hypertension Sister   . Pancreatic cancer Mother   . Diabetes Mother   . Diabetes Maternal Aunt     Allergies  Allergen Reactions  . Lac Bovis Nausea And Vomiting  . Penicillin G Rash    Current Outpatient Medications on File Prior to Visit  Medication Sig Dispense Refill  . glipiZIDE (GLUCOTROL XL) 5 MG 24 hr tablet Take 1 tablet (5 mg total) by mouth daily with breakfast. For diabetes. 90 tablet 3  . lisinopril (PRINIVIL,ZESTRIL) 10 MG tablet TAKE 1 TABLET BY MOUTH EVERY DAY 90 tablet 1  . Omega-3 Fatty Acids (FISH OIL PO) Take 500 mg by mouth daily.    . rosuvastatin (CRESTOR) 5 MG tablet Take 1 tablet by mouth at bedtime for cholesterol. 90 tablet 1  . traMADol (ULTRAM) 50 MG tablet TAKE 1/2 TO 1 TABLET BY MOUTH ONCE A DAY AS NEEDED 60 tablet 0  . vitamin B-12 (CYANOCOBALAMIN) 250 MCG tablet Take 250 mcg by mouth daily.    . vitamin E 400 UNIT capsule Take 400 Units by mouth daily.    Marland Kitchen zolpidem  (AMBIEN) 10 MG tablet TAKE 1 TABLET BY MOUTH AT BEDTIME AS NEEDED FOR SLEEP 90 tablet 0   Current Facility-Administered Medications on File Prior to Visit  Medication Dose Route Frequency Provider Last Rate Last Dose  . 0.9 %  sodium chloride infusion  500 mL Intravenous Once Ladene Artist, MD        BP 122/82   Pulse 81   Temp 98.1 F (36.7 C) (Oral)   Ht 5' (1.524 m)   Wt 132 lb 12 oz (60.2 kg)   SpO2 98%   BMI 25.93 kg/m    Objective:   Physical Exam  Constitutional: She appears well-nourished. She does not appear ill.  HENT:  Right Ear: Tympanic membrane and ear canal normal.  Left Ear: Tympanic membrane and ear canal normal.  Nose: Mucosal edema present. Right sinus exhibits maxillary sinus tenderness and frontal sinus tenderness. Left sinus exhibits frontal sinus tenderness. Left sinus exhibits no maxillary sinus tenderness.  Mouth/Throat: Oropharynx is clear and moist.  Neck: Neck supple.  Cardiovascular: Normal rate and regular rhythm.  Respiratory: Effort normal and breath sounds normal. She has no wheezes.  Skin: Skin is warm and dry.           Assessment & Plan:  Acute Sinusitis:  Symptoms x 14 days, now progressing and without improvement from OTC treatment.  Exam today suspicious given moderate tenderness to sinuses on exam. Given duration of symptoms coupled with exam, will treat. Rx for Zpak sent to pharmacy, PCN allergy. Continue sinus rinses, saline nasal spray. Return precautions provided.  Pleas Koch, NP

## 2019-01-02 ENCOUNTER — Other Ambulatory Visit: Payer: Self-pay | Admitting: Primary Care

## 2019-01-02 DIAGNOSIS — G4700 Insomnia, unspecified: Secondary | ICD-10-CM

## 2019-01-03 NOTE — Telephone Encounter (Signed)
Noted, refill sent to pharmacy. 

## 2019-01-03 NOTE — Telephone Encounter (Signed)
Last prescribed on 10/01/2018 #90 . Last office visit on 12/29/2018. No future appointment

## 2019-02-02 ENCOUNTER — Other Ambulatory Visit: Payer: Self-pay | Admitting: Primary Care

## 2019-02-02 DIAGNOSIS — E785 Hyperlipidemia, unspecified: Secondary | ICD-10-CM

## 2019-02-19 ENCOUNTER — Other Ambulatory Visit: Payer: Self-pay | Admitting: Primary Care

## 2019-02-19 DIAGNOSIS — M1990 Unspecified osteoarthritis, unspecified site: Secondary | ICD-10-CM

## 2019-02-21 NOTE — Telephone Encounter (Signed)
She is overdue for a diabetes visit with lab visit beforehand. Non fasting. Please have her scheduled for a virtual visit.  We will also discuss her Tramadol use at that visit.

## 2019-02-21 NOTE — Telephone Encounter (Signed)
Name of Medication:traMADol (ULTRAM) 50 MG tablet  [facility]  Last Fill or Written Date and Quantity:11/16/18 #90  Last Office Visit and Type: 12/29/18 acute  Next Office Visit and Type:none  Last Controlled Substance Agreement Date:08/05/2018  Last UDS:09/24/2017

## 2019-02-23 NOTE — Telephone Encounter (Signed)
Lvm asking pt to call office 

## 2019-03-09 ENCOUNTER — Other Ambulatory Visit: Payer: Self-pay | Admitting: Primary Care

## 2019-03-09 DIAGNOSIS — E119 Type 2 diabetes mellitus without complications: Secondary | ICD-10-CM

## 2019-03-11 ENCOUNTER — Other Ambulatory Visit: Payer: Self-pay

## 2019-03-11 ENCOUNTER — Other Ambulatory Visit (INDEPENDENT_AMBULATORY_CARE_PROVIDER_SITE_OTHER): Payer: 59

## 2019-03-11 DIAGNOSIS — E119 Type 2 diabetes mellitus without complications: Secondary | ICD-10-CM

## 2019-03-11 LAB — BASIC METABOLIC PANEL
BUN: 16 mg/dL (ref 6–23)
CO2: 25 mEq/L (ref 19–32)
Calcium: 9.2 mg/dL (ref 8.4–10.5)
Chloride: 106 mEq/L (ref 96–112)
Creatinine, Ser: 0.76 mg/dL (ref 0.40–1.20)
GFR: 79.78 mL/min (ref >60.00)
Glucose, Bld: 176 mg/dL — ABNORMAL HIGH (ref 70–99)
Potassium: 3.9 mEq/L (ref 3.5–5.1)
Sodium: 140 mEq/L (ref 135–145)

## 2019-03-11 LAB — HEMOGLOBIN A1C: Hgb A1c MFr Bld: 6 % (ref 4.6–6.5)

## 2019-03-17 ENCOUNTER — Ambulatory Visit (INDEPENDENT_AMBULATORY_CARE_PROVIDER_SITE_OTHER): Payer: 59 | Admitting: Primary Care

## 2019-03-17 ENCOUNTER — Encounter: Payer: Self-pay | Admitting: Primary Care

## 2019-03-17 VITALS — BP 116/72 | Temp 97.7°F | Wt 128.0 lb

## 2019-03-17 DIAGNOSIS — M1990 Unspecified osteoarthritis, unspecified site: Secondary | ICD-10-CM | POA: Diagnosis not present

## 2019-03-17 DIAGNOSIS — G4700 Insomnia, unspecified: Secondary | ICD-10-CM

## 2019-03-17 DIAGNOSIS — E119 Type 2 diabetes mellitus without complications: Secondary | ICD-10-CM | POA: Diagnosis not present

## 2019-03-17 DIAGNOSIS — I10 Essential (primary) hypertension: Secondary | ICD-10-CM | POA: Diagnosis not present

## 2019-03-17 MED ORDER — DICLOFENAC SODIUM 75 MG PO TBEC: 75.0000 mg | DELAYED_RELEASE_TABLET | Freq: Two times a day (BID) | ORAL | 0 refills | Status: DC | PRN

## 2019-03-17 NOTE — Patient Instructions (Signed)
Continue taking Glipizide ER 5 mg once daily for diabetes.  It is important that you improve your diet. Please limit carbohydrates in the form of white bread, rice, pasta, sweets, fast food, fried food, sugary drinks, etc. Increase your consumption of fresh fruits and vegetables, whole grains, lean protein.  Ensure you are consuming 64 ounces of water daily.  Start exercising. You should be getting 150 minutes of moderate intensity exercise weekly.  Try diclofenac ER tablets for arthritis. Our goal is to wean you off of the Tramadol if possible as it is a controlled/addictive substance. Please update me in 1-2 weeks.  Please schedule a physical with me in 6 months. You may also schedule a lab only appointment 3-4 days prior. We will discuss your lab results in detail during your physical.  It was a pleasure to see you today!

## 2019-03-17 NOTE — Assessment & Plan Note (Signed)
Doing well on Ambien, continue same.  

## 2019-03-17 NOTE — Progress Notes (Signed)
Subjective:    Patient ID: Monica Daniel, female    DOB: 10/15/1966, 53 y.o.   MRN: [id number]  HPI  Virtual Visit via Video Note  I connected with [place] on 03/17/19 at 11:40 AM EDT by a video enabled telemedicine application and verified that I am speaking with the correct person using two identifiers.  Location: Patient: Home Provider: Office   I discussed the limitations of evaluation and management by telemedicine and the availability of in person appointments. The patient expressed understanding and agreed to proceed.  History of Present Illness:  Monica Daniel is a 53 year old female who presents today for follow up.  1) Type 2 Diabetes:   Current medications include: Glipizide ER 5 mg  She is checking her blood glucose 2-3 times weekly and is getting readings of:  AM fasting: 90-low 100's.  Last A1C: 6.0 in May 2020 Last Eye Exam: Completed Last Foot Exam: Due next visit Pneumonia Vaccination: Completed in 2019 ACE/ARB: Lisinopril  Statin: Crestor  2) Arthritis/Chronic Back Pain: Currently managed on Tramadol 50 mg for which she's taken for years. Arthritis is predominantly located to the hands and elbows. She did see rheumatology at one point and tested negative for RA.   She's was on Darvocet for years prior to Tramadol and was switched over to Tramadol when Darvocet became unavailable. She's taken Advil and Aleve in the past without improvement. She thinks she's tried Meloxicam but it "tore up my stomach".   3) Essential Hypertension: Currently managed on lisinopril 10 mg daily. She denies chest pain, shortness of breath.   BP Readings from Last 3 Encounters:  03/17/19 116/72  12/29/18 122/82  08/05/18 130/86     4) Insomnia: Currently managed on Ambien 10 mg daily. Doing well on this and is taking every night.     Observations/Objective:  Alert and oriented. Appears well, not sickly. No distress. Speaking in complete sentences.    Assessment and Plan:  See problem based charting.  Follow Up Instructions:  Continue taking Glipizide ER 5 mg once daily for diabetes.  It is important that you improve your diet. Please limit carbohydrates in the form of white bread, rice, pasta, sweets, fast food, fried food, sugary drinks, etc. Increase your consumption of fresh fruits and vegetables, whole grains, lean protein.  Ensure you are consuming 64 ounces of water daily.  Start exercising. You should be getting 150 minutes of moderate intensity exercise weekly.  Try diclofenac ER tablets for arthritis. Our goal is to wean you off of the Tramadol if possible as it is a controlled/addictive substance. Please update me in 1-2 weeks.  Please schedule a physical with me in 6 months. You may also schedule a lab only appointment 3-4 days prior. We will discuss your lab results in detail during your physical.  It was a pleasure to see you today!    I discussed the assessment and treatment plan with the patient. The patient was provided an opportunity to ask questions and all were answered. The patient agreed with the plan and demonstrated an understanding of the instructions.   The patient was advised to call back or seek an in-person evaluation if the symptoms worsen or if the condition fails to improve as anticipated.     Pleas Koch, NP    Review of Systems  Respiratory: Negative for shortness of breath.   Cardiovascular: Negative for chest pain.  Musculoskeletal: Positive for arthralgias.  Chronic arthritis  Psychiatric/Behavioral: Negative for sleep disturbance. The patient is not nervous/anxious.        Past Medical History:  Diagnosis Date  . Allergy   . Arthritis   . Cancer ([place]) 2011   basal cell on lip  . Chickenpox   . Glaucoma    no meds  . Hyperlipidemia   . Seizures ([place])    as a child/ last one 54-76 years old  . Torus palatinus      Social History   Socioeconomic History  .  Marital status: Married    Spouse name: Not on file  . Number of children: Not on file  . Years of education: Not on file  . Highest education level: Not on file  Occupational History  . Not on file  Social Needs  . Financial resource strain: Not on file  . Food insecurity:    Worry: Not on file    Inability: Not on file  . Transportation needs:    Medical: Not on file    Non-medical: Not on file  Tobacco Use  . Smoking status: Current Every Day Smoker    Packs/day: 0.50    Types: Cigarettes  . Smokeless tobacco: Never Used  Substance and Sexual Activity  . Alcohol use: Yes    Alcohol/week: 0.0 standard drinks    Comment: rarely  . Drug use: Not on file  . Sexual activity: Not on file  Lifestyle  . Physical activity:    Days per week: Not on file    Minutes per session: Not on file  . Stress: Not on file  Relationships  . Social connections:    Talks on phone: Not on file    Gets together: Not on file    Attends religious service: Not on file    Active member of club or organization: Not on file    Attends meetings of clubs or organizations: Not on file    Relationship status: Not on file  . Intimate partner violence:    Fear of current or ex partner: Not on file    Emotionally abused: Not on file    Physically abused: Not on file    Forced sexual activity: Not on file  Other Topics Concern  . Not on file  Social History Narrative   Married.   2 children.   Works doing Radiographer, therapeutic.   Enjoys going to [employer], fishing, gardening.    Past Surgical History:  Procedure Laterality Date  . ABDOMINAL HYSTERECTOMY  2002   per pt was vaginal hysterectomy  . [place]   left  . REDUCTION MAMMAPLASTY Bilateral 20+yrs ago   cut under armpit are/ not full reduction  . TUBAL LIGATION  1991    Family History  Problem Relation Age of Onset  . Arthritis Father   . Lung cancer Father   . Hypertension Sister   . Pancreatic cancer Mother    . Diabetes Mother   . Diabetes Maternal Aunt     Allergies  Allergen Reactions  . Lac Bovis Nausea And Vomiting  . Penicillin G Rash    Current Outpatient Medications on File Prior to Visit  Medication Sig Dispense Refill  . glipiZIDE (GLUCOTROL XL) 5 MG 24 hr tablet Take 1 tablet (5 mg total) by mouth daily with breakfast. For diabetes. 90 tablet 3  . lisinopril (PRINIVIL,ZESTRIL) 10 MG tablet TAKE 1 TABLET BY MOUTH EVERY DAY 90 tablet 1  . Omega-3  Fatty Acids (FISH OIL PO) Take 500 mg by mouth daily.    . rosuvastatin (CRESTOR) 5 MG tablet TAKE 1 TABLET BY MOUTH EVERY DAY AT BEDTIME FOR CHOLESTEROL 90 tablet 1  . traMADol (ULTRAM) 50 MG tablet TAKE 1/2 TO 1 TABLET BY MOUTH ONCE A DAY AS NEEDED 30 tablet 0  . vitamin B-12 (CYANOCOBALAMIN) 250 MCG tablet Take 250 mcg by mouth daily.    . vitamin E 400 UNIT capsule Take 400 Units by mouth daily.    Marland Kitchen zolpidem (AMBIEN) 10 MG tablet TAKE 1 TABLET BY MOUTH EVERY DAY AT BEDTIME AS NEEDED FOR SLEEP 90 tablet 0   Current Facility-Administered Medications on File Prior to Visit  Medication Dose Route Frequency Provider Last Rate Last Dose  . 0.9 %  sodium chloride infusion  500 mL Intravenous Once Ladene Artist, MD        BP 116/72   Temp 97.7 F (36.5 C)   Wt 128 lb (58.1 kg)   BMI 25.00 kg/m    Objective:   Physical Exam  Constitutional: She is oriented to person, place, and time. She appears well-nourished.  Respiratory: Effort normal.  Neurological: She is alert and oriented to person, place, and time.  Psychiatric: She has a normal mood and affect.           Assessment & Plan:

## 2019-03-17 NOTE — Assessment & Plan Note (Signed)
Chronic. Discussion today regarding the need to get off of Tramadol and to trial other options. She is very open to other options for her pain.  We will trial Diclofenac ER 75 mg 1-2 times daily. If no improvement then consider low dose duloxetine.   She will update.

## 2019-03-17 NOTE — Assessment & Plan Note (Signed)
Recent A1C of 6.0 which is very stable. Continue Glipizide ER 5 mg.  Eye exam and pneumonia vaccination UTD. Foot exam next visit. Managed on ACE and statin.  Follow up in 6 months.

## 2019-03-17 NOTE — Assessment & Plan Note (Signed)
Stable. Continue lisinopril. Recent BMP unremarkable.

## 2019-04-18 ENCOUNTER — Other Ambulatory Visit: Payer: Self-pay | Admitting: Primary Care

## 2019-04-18 DIAGNOSIS — G4700 Insomnia, unspecified: Secondary | ICD-10-CM

## 2019-04-20 NOTE — Telephone Encounter (Signed)
Last prescribed on 01/03/2019 #90. Last appointment on 03/17/2019. No future appointment

## 2019-04-20 NOTE — Telephone Encounter (Signed)
Noted, refill sent to pharmacy. 

## 2019-04-20 NOTE — Telephone Encounter (Signed)
Pt called to check on refill.She has 1 left.

## 2019-04-21 ENCOUNTER — Telehealth: Payer: Self-pay | Admitting: Primary Care

## 2019-04-21 ENCOUNTER — Telehealth: Payer: Self-pay | Admitting: General Practice

## 2019-04-21 DIAGNOSIS — Z20822 Contact with and (suspected) exposure to covid-19: Secondary | ICD-10-CM

## 2019-04-21 NOTE — Telephone Encounter (Signed)
Received request to call pt's spouse Jaquelynn Wanamaker to schedule Covid-19 testing. When called spouse stated that pt should be tested as well, no symptoms but states, ' if I am being tested then it make sense to have my wife tested also"  Advised that I would have to have the request to schedule from provider.   Please assist.

## 2019-04-21 NOTE — Telephone Encounter (Signed)
Pt has been scheduled Covid-19 test. Scheduled with pt directly.  Pt was referred by: Alma Friendly NP

## 2019-04-21 NOTE — Addendum Note (Signed)
Addended by: Denman George on: 04/21/2019 03:21 PM   Modules accepted: Orders

## 2019-04-21 NOTE — Telephone Encounter (Signed)
Yes, patient's wife should be tested if this is possible.

## 2019-04-21 NOTE — Telephone Encounter (Signed)
Rosanne Ashing chart sent to Gentry Fitz NP also.

## 2019-04-22 ENCOUNTER — Other Ambulatory Visit: Payer: 59

## 2019-04-22 DIAGNOSIS — Z20822 Contact with and (suspected) exposure to covid-19: Secondary | ICD-10-CM

## 2019-04-24 LAB — NOVEL CORONAVIRUS, NAA: SARS-CoV-2, NAA: NOT DETECTED

## 2019-05-28 ENCOUNTER — Other Ambulatory Visit: Payer: Self-pay | Admitting: Primary Care

## 2019-05-28 DIAGNOSIS — I10 Essential (primary) hypertension: Secondary | ICD-10-CM

## 2019-07-19 ENCOUNTER — Other Ambulatory Visit: Payer: Self-pay | Admitting: Primary Care

## 2019-07-19 DIAGNOSIS — G4700 Insomnia, unspecified: Secondary | ICD-10-CM

## 2019-07-20 ENCOUNTER — Other Ambulatory Visit: Payer: Self-pay | Admitting: Family Medicine

## 2019-07-20 DIAGNOSIS — G4700 Insomnia, unspecified: Secondary | ICD-10-CM

## 2019-07-20 MED ORDER — ZOLPIDEM TARTRATE 10 MG PO TABS: ORAL_TABLET | ORAL | 0 refills | Status: DC

## 2019-07-20 NOTE — Telephone Encounter (Signed)
Refill in [place] absence. Last prescribed on 04/20/2019 #90 with 0 refill. Last appointment on  03/17/2019 (acute). No future appointment

## 2019-08-25 ENCOUNTER — Other Ambulatory Visit: Payer: Self-pay | Admitting: Primary Care

## 2019-08-25 DIAGNOSIS — E119 Type 2 diabetes mellitus without complications: Secondary | ICD-10-CM

## 2019-09-15 ENCOUNTER — Encounter: Payer: Self-pay | Admitting: Primary Care

## 2019-09-15 ENCOUNTER — Ambulatory Visit (INDEPENDENT_AMBULATORY_CARE_PROVIDER_SITE_OTHER): Payer: BC Managed Care – PPO | Admitting: Primary Care

## 2019-09-15 DIAGNOSIS — G8929 Other chronic pain: Secondary | ICD-10-CM

## 2019-09-15 DIAGNOSIS — I10 Essential (primary) hypertension: Secondary | ICD-10-CM | POA: Diagnosis not present

## 2019-09-15 DIAGNOSIS — M1990 Unspecified osteoarthritis, unspecified site: Secondary | ICD-10-CM

## 2019-09-15 DIAGNOSIS — M545 Low back pain, unspecified: Secondary | ICD-10-CM

## 2019-09-15 DIAGNOSIS — E119 Type 2 diabetes mellitus without complications: Secondary | ICD-10-CM | POA: Diagnosis not present

## 2019-09-15 DIAGNOSIS — G4700 Insomnia, unspecified: Secondary | ICD-10-CM | POA: Diagnosis not present

## 2019-09-15 MED ORDER — ZOLPIDEM TARTRATE 10 MG PO TABS: ORAL_TABLET | ORAL | 0 refills | Status: DC

## 2019-09-15 NOTE — Assessment & Plan Note (Signed)
No longer on tramadol, never took diclofenac. Discussed other options including gabapentin versus Cymbalta.  She will do some research and update.

## 2019-09-15 NOTE — Assessment & Plan Note (Signed)
No longer on tramadol, prefers not to take diclofenac. Discussed options for pain including gabapentin versus Cymbalta.  She will update after doing some research.

## 2019-09-15 NOTE — Assessment & Plan Note (Signed)
Stable with home readings, continue lisinopril 10 mg. CMP pending.

## 2019-09-15 NOTE — Assessment & Plan Note (Signed)
Repeat A1c pending. Continue glipizide ER 5 mg daily.  Managed on statin and ACE. Foot exam next visit. Eye exam up-to-date. Pneumonia vaccination up-to-date.  Follow-up in 6 months.

## 2019-09-15 NOTE — Patient Instructions (Signed)
Please notify me if you are interested in either Cymbalta or gabapentin for chronic pain.  Please call the main line to schedule a lab only appointment for fasting labs.  I will send in a refill of of your Ambien.  Please schedule a follow up appointment in 6 months.   It was a pleasure to see you today! [name], NP-C

## 2019-09-15 NOTE — Assessment & Plan Note (Signed)
Doing well on Ambien nightly, continue same.

## 2019-09-15 NOTE — Progress Notes (Signed)
Subjective:    Patient ID: Monica Daniel, female    DOB: [birth year], 53 y.o.   MRN: [id number]  HPI  Virtual Visit via Video Note  I connected with [place] on 09/15/19 at 11:00 AM EST by a video enabled telemedicine application and verified that I am speaking with the correct person using two identifiers.  Location: Patient: Home Provider: Office   I discussed the limitations of evaluation and management by telemedicine and the availability of in person appointments. The patient expressed understanding and agreed to proceed.  History of Present Illness:  Monica Daniel is a 53 year old female who presents today for follow up.  1) Type 2 Diabetes:   Current medications include: Glipizide ER 5 mg  She is checking her blood glucose 2-3 times weekly and is getting readings of:  AM fasting: 90's-low 100's  Last A1C: 6.0 in May 2020 Last Eye Exam: Completed in February 2021 Last Foot Exam: Due next visit  Pneumonia Vaccination: Completed in 2019 ACE/ARB: Lisinopril  Statin: Crestor  2) Arthritis: Previously managed on Tramadol but during our last visit in May 2020 we discussed to limit use due to potential for addiction/dependance. She was initiated on diclofenac ER 75 mg BID and asked to follow up.  Since her last visit she never picked up the diclofenac as she read the label of side effects and became frightened.  She is now taking Tylenol arthritis without much improvement.  Most of her pain is to the hands, elbows, hips, knees.  She is never tried anything else except for tramadol and over-the-counter medications.  3) Insomnia: Currently managed on zolpidem 10 mg and doing well.  She is needing a refill today.  4) Essential Hypertension: Currently managed on lisinopril 10 mg daily.  She denies dizziness, headaches, chest pain.  BP Readings from Last 3 Encounters:  09/15/19 123/78  03/17/19 116/72  12/29/18 122/82      Observations/Objective:  Alert and  oriented. Appears well, not sickly. No distress. Speaking in complete sentences.   Assessment and Plan:  See problem based charting  Follow Up Instructions:  Please notify me if you are interested in either Cymbalta or gabapentin for chronic pain.  Please call the main line to schedule a lab only appointment for fasting labs.  I will send in a refill of of your Ambien.  Please schedule a follow up appointment in 6 months.   It was a pleasure to see you today! [name], NP-C    I discussed the assessment and treatment plan with the patient. The patient was provided an opportunity to ask questions and all were answered. The patient agreed with the plan and demonstrated an understanding of the instructions.   The patient was advised to call back or seek an in-person evaluation if the symptoms worsen or if the condition fails to improve as anticipated.     Pleas Koch, NP    Review of Systems  Eyes: Negative for visual disturbance.  Respiratory: Negative for shortness of breath.   Cardiovascular: Negative for chest pain.  Neurological: Negative for dizziness and headaches.       Past Medical History:  Diagnosis Date  . Allergy   . Arthritis   . Cancer ([place]) 2011   basal cell on lip  . Chickenpox   . Glaucoma    no meds  . Hyperlipidemia   . Seizures ([place])    as a child/ last one 64-35 years old  .  Torus palatinus      Social History   Socioeconomic History  . Marital status: Married    Spouse name: Not on file  . Number of children: Not on file  . Years of education: Not on file  . Highest education level: Not on file  Occupational History  . Not on file  Social Needs  . Financial resource strain: Not on file  . Food insecurity    Worry: Not on file    Inability: Not on file  . Transportation needs    Medical: Not on file    Non-medical: Not on file  Tobacco Use  . Smoking status: Current Every Day Smoker    Packs/day: 0.50    Types:  Cigarettes  . Smokeless tobacco: Never Used  Substance and Sexual Activity  . Alcohol use: Yes    Alcohol/week: 0.0 standard drinks    Comment: rarely  . Drug use: Not on file  . Sexual activity: Not on file  Lifestyle  . Physical activity    Days per week: Not on file    Minutes per session: Not on file  . Stress: Not on file  Relationships  . Social Herbalist on phone: Not on file    Gets together: Not on file    Attends religious service: Not on file    Active member of club or organization: Not on file    Attends meetings of clubs or organizations: Not on file    Relationship status: Not on file  . Intimate partner violence    Fear of current or ex partner: Not on file    Emotionally abused: Not on file    Physically abused: Not on file    Forced sexual activity: Not on file  Other Topics Concern  . Not on file  Social History Narrative   Married.   2 children.   Works doing Radiographer, therapeutic.   Enjoys going to [employer], fishing, gardening.    Past Surgical History:  Procedure Laterality Date  . ABDOMINAL HYSTERECTOMY  2002   per pt was vaginal hysterectomy  . [place]   left  . REDUCTION MAMMAPLASTY Bilateral 20+yrs ago   cut under armpit are/ not full reduction  . TUBAL LIGATION  1991    Family History  Problem Relation Age of Onset  . Arthritis Father   . Lung cancer Father   . Hypertension Sister   . Pancreatic cancer Mother   . Diabetes Mother   . Diabetes Maternal Aunt     Allergies  Allergen Reactions  . Lac Bovis Nausea And Vomiting  . Penicillin G Rash    Current Outpatient Medications on File Prior to Visit  Medication Sig Dispense Refill  . glipiZIDE (GLUCOTROL XL) 5 MG 24 hr tablet TAKE 1 TABLET (5 MG TOTAL) BY MOUTH DAILY WITH BREAKFAST. FOR DIABETES. 90 tablet 1  . lisinopril (ZESTRIL) 10 MG tablet TAKE 1 TABLET BY MOUTH EVERY DAY 90 tablet 1  . Omega-3 Fatty Acids (FISH OIL PO) Take 500 mg by mouth  daily.    . rosuvastatin (CRESTOR) 5 MG tablet TAKE 1 TABLET BY MOUTH EVERY DAY AT BEDTIME FOR CHOLESTEROL 90 tablet 1  . vitamin B-12 (CYANOCOBALAMIN) 250 MCG tablet Take 250 mcg by mouth daily.    . vitamin E 400 UNIT capsule Take 400 Units by mouth daily.    Marland Kitchen zolpidem (AMBIEN) 10 MG tablet TAKE 1 TABLET BY MOUTH EVERY  DAY AT BEDTIME AS NEEDED FOR SLEEP 60 tablet 0   Current Facility-Administered Medications on File Prior to Visit  Medication Dose Route Frequency Provider Last Rate Last Dose  . 0.9 %  sodium chloride infusion  500 mL Intravenous Once Ladene Artist, MD        BP 123/78   Temp (!) 97.5 F (36.4 C) (Temporal)   Wt 127 lb (57.6 kg)   BMI 24.80 kg/m    Objective:   Physical Exam  Constitutional: She appears well-nourished.  Neck: Neck supple.  Cardiovascular: Normal rate and regular rhythm.  Respiratory: Effort normal and breath sounds normal.  Skin: Skin is warm and dry.  Psychiatric: She has a normal mood and affect.           Assessment & Plan:

## 2019-09-23 MED ORDER — DULOXETINE HCL 20 MG PO CPEP: 20.0000 mg | ORAL_CAPSULE | Freq: Every day | ORAL | 1 refills | Status: DC

## 2019-09-28 ENCOUNTER — Other Ambulatory Visit: Payer: Self-pay

## 2019-09-28 DIAGNOSIS — Z20822 Contact with and (suspected) exposure to covid-19: Secondary | ICD-10-CM

## 2019-09-30 LAB — NOVEL CORONAVIRUS, NAA: SARS-CoV-2, NAA: NOT DETECTED

## 2019-10-18 ENCOUNTER — Other Ambulatory Visit: Payer: Self-pay | Admitting: Primary Care

## 2019-10-18 DIAGNOSIS — G8929 Other chronic pain: Secondary | ICD-10-CM

## 2019-10-18 DIAGNOSIS — M1990 Unspecified osteoarthritis, unspecified site: Secondary | ICD-10-CM

## 2019-10-18 DIAGNOSIS — M545 Low back pain: Secondary | ICD-10-CM

## 2019-11-17 ENCOUNTER — Other Ambulatory Visit: Payer: Self-pay | Admitting: Primary Care

## 2019-11-17 DIAGNOSIS — M545 Low back pain, unspecified: Secondary | ICD-10-CM

## 2019-11-17 DIAGNOSIS — M1990 Unspecified osteoarthritis, unspecified site: Secondary | ICD-10-CM

## 2019-11-17 DIAGNOSIS — G8929 Other chronic pain: Secondary | ICD-10-CM

## 2019-11-18 NOTE — Telephone Encounter (Signed)
See my chart message

## 2019-11-18 NOTE — Telephone Encounter (Signed)
Last prescribed on 09/23/2019 . Last appointment on 09/15/2019 . No future appointment

## 2019-11-24 ENCOUNTER — Other Ambulatory Visit: Payer: Self-pay | Admitting: Primary Care

## 2019-11-24 DIAGNOSIS — I10 Essential (primary) hypertension: Secondary | ICD-10-CM

## 2019-12-09 ENCOUNTER — Other Ambulatory Visit: Payer: Self-pay | Admitting: Primary Care

## 2019-12-09 DIAGNOSIS — E785 Hyperlipidemia, unspecified: Secondary | ICD-10-CM

## 2019-12-25 ENCOUNTER — Other Ambulatory Visit: Payer: Self-pay | Admitting: Primary Care

## 2019-12-25 DIAGNOSIS — G4700 Insomnia, unspecified: Secondary | ICD-10-CM

## 2019-12-26 NOTE — Telephone Encounter (Signed)
Noted, refill sent to pharmacy. 

## 2019-12-26 NOTE — Telephone Encounter (Signed)
Last prescribed on 09/15/2019 #90 with 0 refill . Last appointment on 09/15/2019. No future appointment

## 2020-01-26 ENCOUNTER — Other Ambulatory Visit (INDEPENDENT_AMBULATORY_CARE_PROVIDER_SITE_OTHER): Payer: BC Managed Care – PPO

## 2020-01-26 ENCOUNTER — Other Ambulatory Visit: Payer: Self-pay

## 2020-01-26 DIAGNOSIS — E119 Type 2 diabetes mellitus without complications: Secondary | ICD-10-CM | POA: Diagnosis not present

## 2020-01-26 DIAGNOSIS — I10 Essential (primary) hypertension: Secondary | ICD-10-CM

## 2020-01-26 LAB — COMPREHENSIVE METABOLIC PANEL
ALT: 23 U/L (ref 0–35)
AST: 20 U/L (ref 0–37)
Albumin: 4.4 g/dL (ref 3.5–5.2)
Alkaline Phosphatase: 79 U/L (ref 39–117)
BUN: 19 mg/dL (ref 6–23)
CO2: 26 mEq/L (ref 19–32)
Calcium: 9.6 mg/dL (ref 8.4–10.5)
Chloride: 108 mEq/L (ref 96–112)
Creatinine, Ser: 0.82 mg/dL (ref 0.40–1.20)
GFR: 72.84 mL/min (ref >60.00)
Glucose, Bld: 109 mg/dL — ABNORMAL HIGH (ref 70–99)
Potassium: 4 mEq/L (ref 3.5–5.1)
Sodium: 139 mEq/L (ref 135–145)
Total Bilirubin: 0.5 mg/dL (ref 0.2–1.2)
Total Protein: 7.1 g/dL (ref 6.0–8.3)

## 2020-01-26 LAB — LIPID PANEL
Cholesterol: 114 mg/dL (ref 0–200)
HDL: 41.4 mg/dL (ref >39.00)
LDL Cholesterol: 45 mg/dL (ref 0–99)
NonHDL: 72.94
Total CHOL/HDL Ratio: 3
Triglycerides: 141 mg/dL (ref 0.0–149.0)
VLDL: 28.2 mg/dL (ref 0.0–40.0)

## 2020-01-26 LAB — HEMOGLOBIN A1C: Hgb A1c MFr Bld: 6.2 % (ref 4.6–6.5)

## 2020-02-01 LAB — HM DIABETES EYE EXAM

## 2020-02-24 ENCOUNTER — Other Ambulatory Visit: Payer: Self-pay | Admitting: Primary Care

## 2020-02-24 DIAGNOSIS — E119 Type 2 diabetes mellitus without complications: Secondary | ICD-10-CM

## 2020-03-24 ENCOUNTER — Other Ambulatory Visit: Payer: Self-pay | Admitting: Primary Care

## 2020-03-24 DIAGNOSIS — G4700 Insomnia, unspecified: Secondary | ICD-10-CM

## 2020-03-24 NOTE — Telephone Encounter (Signed)
Last filled 12-26-19 Last OV 09-15-19 No Future OV [facility]

## 2020-03-26 NOTE — Telephone Encounter (Signed)
Noted, refill sent to pharmacy. 

## 2020-05-07 DIAGNOSIS — H40053 Ocular hypertension, bilateral: Secondary | ICD-10-CM | POA: Diagnosis not present

## 2020-06-05 ENCOUNTER — Other Ambulatory Visit: Payer: Self-pay | Admitting: Primary Care

## 2020-06-05 DIAGNOSIS — I10 Essential (primary) hypertension: Secondary | ICD-10-CM

## 2020-06-24 ENCOUNTER — Other Ambulatory Visit: Payer: Self-pay | Admitting: Primary Care

## 2020-06-24 DIAGNOSIS — G4700 Insomnia, unspecified: Secondary | ICD-10-CM

## 2020-06-26 NOTE — Telephone Encounter (Signed)
Last prescribed on 03/26/2020 Last OV (follow up) with [name] on 09/15/2019  No future OV scheduled

## 2020-06-27 NOTE — Telephone Encounter (Signed)
Please notify patient that I sent a refill request of her Ambien, but she will need an office visit in November 2021 for further refills. Would like to do CPE. Please schedule.

## 2020-07-02 NOTE — Telephone Encounter (Signed)
Called patient to schedule for cpe or OV. LVM to call back.

## 2020-07-05 NOTE — Telephone Encounter (Signed)
Called patient again to schedule cpe. LVM to call back. Letter has been sent.

## 2020-07-07 ENCOUNTER — Other Ambulatory Visit: Payer: Self-pay | Admitting: Primary Care

## 2020-07-07 DIAGNOSIS — E785 Hyperlipidemia, unspecified: Secondary | ICD-10-CM

## 2020-08-21 ENCOUNTER — Other Ambulatory Visit: Payer: Self-pay | Admitting: Primary Care

## 2020-08-21 DIAGNOSIS — Z1231 Encounter for screening mammogram for malignant neoplasm of breast: Secondary | ICD-10-CM

## 2020-09-13 ENCOUNTER — Other Ambulatory Visit: Payer: Self-pay | Admitting: Primary Care

## 2020-09-13 DIAGNOSIS — E559 Vitamin D deficiency, unspecified: Secondary | ICD-10-CM

## 2020-09-13 DIAGNOSIS — I10 Essential (primary) hypertension: Secondary | ICD-10-CM

## 2020-09-13 DIAGNOSIS — E119 Type 2 diabetes mellitus without complications: Secondary | ICD-10-CM

## 2020-09-24 ENCOUNTER — Other Ambulatory Visit: Payer: Self-pay

## 2020-09-24 ENCOUNTER — Other Ambulatory Visit (INDEPENDENT_AMBULATORY_CARE_PROVIDER_SITE_OTHER): Payer: BC Managed Care – PPO

## 2020-09-24 DIAGNOSIS — I10 Essential (primary) hypertension: Secondary | ICD-10-CM

## 2020-09-24 DIAGNOSIS — E559 Vitamin D deficiency, unspecified: Secondary | ICD-10-CM | POA: Diagnosis not present

## 2020-09-24 DIAGNOSIS — E119 Type 2 diabetes mellitus without complications: Secondary | ICD-10-CM | POA: Diagnosis not present

## 2020-09-24 LAB — CBC
HCT: 38.6 % (ref 36.0–46.0)
Hemoglobin: 13.5 g/dL (ref 12.0–15.0)
MCHC: 35.1 g/dL (ref 30.0–36.0)
MCV: 92.4 fl (ref 78.0–100.0)
Platelets: 447 10*3/uL — ABNORMAL HIGH (ref 150.0–400.0)
RBC: 4.17 Mil/uL (ref 3.87–5.11)
RDW: 13.1 % (ref 11.5–15.5)
WBC: 6.8 10*3/uL (ref 4.0–10.5)

## 2020-09-24 LAB — LIPID PANEL
Cholesterol: 109 mg/dL (ref 0–200)
HDL: 44.7 mg/dL (ref >39.00)
LDL Cholesterol: 49 mg/dL (ref 0–99)
NonHDL: 64.11
Total CHOL/HDL Ratio: 2
Triglycerides: 76 mg/dL (ref 0.0–149.0)
VLDL: 15.2 mg/dL (ref 0.0–40.0)

## 2020-09-24 LAB — COMPREHENSIVE METABOLIC PANEL
ALT: 12 U/L (ref 0–35)
AST: 13 U/L (ref 0–37)
Albumin: 4.4 g/dL (ref 3.5–5.2)
Alkaline Phosphatase: 64 U/L (ref 39–117)
BUN: 20 mg/dL (ref 6–23)
CO2: 27 mEq/L (ref 19–32)
Calcium: 9.5 mg/dL (ref 8.4–10.5)
Chloride: 107 mEq/L (ref 96–112)
Creatinine, Ser: 0.81 mg/dL (ref 0.40–1.20)
GFR: 82.56 mL/min (ref >60.00)
Glucose, Bld: 97 mg/dL (ref 70–99)
Potassium: 4 mEq/L (ref 3.5–5.1)
Sodium: 141 mEq/L (ref 135–145)
Total Bilirubin: 0.4 mg/dL (ref 0.2–1.2)
Total Protein: 7 g/dL (ref 6.0–8.3)

## 2020-09-24 LAB — HEMOGLOBIN A1C: Hgb A1c MFr Bld: 6.7 % — ABNORMAL HIGH (ref 4.6–6.5)

## 2020-09-24 LAB — VITAMIN D 25 HYDROXY (VIT D DEFICIENCY, FRACTURES): VITD: 37.71 ng/mL (ref 30.00–100.00)

## 2020-09-27 ENCOUNTER — Ambulatory Visit (INDEPENDENT_AMBULATORY_CARE_PROVIDER_SITE_OTHER): Payer: BC Managed Care – PPO | Admitting: Primary Care

## 2020-09-27 ENCOUNTER — Other Ambulatory Visit: Payer: Self-pay

## 2020-09-27 VITALS — BP 118/64 | HR 74 | Temp 98.2°F | Ht 60.0 in | Wt 135.0 lb

## 2020-09-27 DIAGNOSIS — Z0001 Encounter for general adult medical examination with abnormal findings: Secondary | ICD-10-CM

## 2020-09-27 DIAGNOSIS — M1990 Unspecified osteoarthritis, unspecified site: Secondary | ICD-10-CM

## 2020-09-27 DIAGNOSIS — I10 Essential (primary) hypertension: Secondary | ICD-10-CM

## 2020-09-27 DIAGNOSIS — M545 Low back pain, unspecified: Secondary | ICD-10-CM | POA: Diagnosis not present

## 2020-09-27 DIAGNOSIS — E119 Type 2 diabetes mellitus without complications: Secondary | ICD-10-CM

## 2020-09-27 DIAGNOSIS — G4700 Insomnia, unspecified: Secondary | ICD-10-CM | POA: Diagnosis not present

## 2020-09-27 DIAGNOSIS — H409 Unspecified glaucoma: Secondary | ICD-10-CM

## 2020-09-27 DIAGNOSIS — G8929 Other chronic pain: Secondary | ICD-10-CM

## 2020-09-27 MED ORDER — ZOLPIDEM TARTRATE 10 MG PO TABS: ORAL_TABLET | ORAL | 0 refills | Status: DC

## 2020-09-27 MED ORDER — GABAPENTIN 100 MG PO CAPS: ORAL_CAPSULE | ORAL | 0 refills | Status: DC

## 2020-09-27 NOTE — Assessment & Plan Note (Signed)
Increase to A1C of 6.7 which is an increase from last check. Recommended to work on activity level and improve diet.   Continue Glipizide XL 5 mg. No changes made. Foot exam today. Eye exam UTD. Managed on statin and ACE-I. Pneumonia vaccination UTD.  Follow up in 6 months.

## 2020-09-27 NOTE — Assessment & Plan Note (Signed)
She is ready to try gabapentin, no improvement with Cymbalta. Low dose gabapentin sent to pharmacy with instructions on titration. Warnings provided with use of Ambien.   She will update.

## 2020-09-27 NOTE — Assessment & Plan Note (Signed)
Mostly does well on Ambien, would like to continue.  Refill provided today.

## 2020-09-27 NOTE — Assessment & Plan Note (Signed)
Follows with ophthalmology

## 2020-09-27 NOTE — Assessment & Plan Note (Addendum)
Well controlled on lisinopril 10 mg. CMP reviewed. Continue lisinopril 10 mg daily.

## 2020-09-27 NOTE — Patient Instructions (Signed)
Start exercising. You should be getting 150 minutes of moderate intensity exercise weekly.  It's important to improve your diet by reducing consumption of fried food, processed snack foods, sugary drinks. Increase consumption of fresh vegetables and fruits, whole grains, water.  Ensure you are drinking 64 ounces of water daily.  You can try gabapentin 100 mg for pain. Take 1 or 2 capsules by mouth up to twice daily for pain. Please update me as discussed.  Complete your mammogram in December.  It was a pleasure to see you today!   Preventive Care 54-7 Years Old, Female Preventive care refers to visits with your health care provider and lifestyle choices that can promote health and wellness. This includes:  A yearly physical exam. This may also be called an annual well check.  Regular dental visits and eye exams.  Immunizations.  Screening for certain conditions.  Healthy lifestyle choices, such as eating a healthy diet, getting regular exercise, not using drugs or products that contain nicotine and tobacco, and limiting alcohol use. What can I expect for my preventive care visit? Physical exam Your health care provider will check your:  Height and weight. This may be used to calculate body mass index (BMI), which tells if you are at a healthy weight.  Heart rate and blood pressure.  Skin for abnormal spots. Counseling Your health care provider may ask you questions about your:  Alcohol, tobacco, and drug use.  Emotional well-being.  Home and relationship well-being.  Sexual activity.  Eating habits.  Work and work Statistician.  Method of birth control.  Menstrual cycle.  Pregnancy history. What immunizations do I need?  Influenza (flu) vaccine  This is recommended every year. Tetanus, diphtheria, and pertussis (Tdap) vaccine  You may need a Td booster every 10 years. Varicella (chickenpox) vaccine  You may need this if you have not been  vaccinated. Zoster (shingles) vaccine  You may need this after age 69. Measles, mumps, and rubella (MMR) vaccine  You may need at least one dose of MMR if you were born in 1957 or later. You may also need a second dose. Pneumococcal conjugate (PCV13) vaccine  You may need this if you have certain conditions and were not previously vaccinated. Pneumococcal polysaccharide (PPSV23) vaccine  You may need one or two doses if you smoke cigarettes or if you have certain conditions. Meningococcal conjugate (MenACWY) vaccine  You may need this if you have certain conditions. Hepatitis A vaccine  You may need this if you have certain conditions or if you travel or work in places where you may be exposed to hepatitis A. Hepatitis B vaccine  You may need this if you have certain conditions or if you travel or work in places where you may be exposed to hepatitis B. Haemophilus influenzae type b (Hib) vaccine  You may need this if you have certain conditions. Human papillomavirus (HPV) vaccine  If recommended by your health care provider, you may need three doses over 6 months. You may receive vaccines as individual doses or as more than one vaccine together in one shot (combination vaccines). Talk with your health care provider about the risks and benefits of combination vaccines. What tests do I need? Blood tests  Lipid and cholesterol levels. These may be checked every 5 years, or more frequently if you are over 65 years old.  Hepatitis C test.  Hepatitis B test. Screening  Lung cancer screening. You may have this screening every year starting at age 34 if  you have a 30-pack-year history of smoking and currently smoke or have quit within the past 15 years.  Colorectal cancer screening. All adults should have this screening starting at age 13 and continuing until age 83. Your health care provider may recommend screening at age 60 if you are at increased risk. You will have tests every  1-10 years, depending on your results and the type of screening test.  Diabetes screening. This is done by checking your blood sugar (glucose) after you have not eaten for a while (fasting). You may have this done every 1-3 years.  Mammogram. This may be done every 1-2 years. Talk with your health care provider about when you should start having regular mammograms. This may depend on whether you have a family history of breast cancer.  BRCA-related cancer screening. This may be done if you have a family history of breast, ovarian, tubal, or peritoneal cancers.  Pelvic exam and Pap test. This may be done every 3 years starting at age 70. Starting at age 23, this may be done every 5 years if you have a Pap test in combination with an HPV test. Other tests  Sexually transmitted disease (STD) testing.  Bone density scan. This is done to screen for osteoporosis. You may have this scan if you are at high risk for osteoporosis. Follow these instructions at home: Eating and drinking  Eat a diet that includes fresh fruits and vegetables, whole grains, lean protein, and low-fat dairy.  Take vitamin and mineral supplements as recommended by your health care provider.  Do not drink alcohol if: ? Your health care provider tells you not to drink. ? You are pregnant, may be pregnant, or are planning to become pregnant.  If you drink alcohol: ? Limit how much you have to 0-1 drink a day. ? Be aware of how much alcohol is in your drink. In the [place] one drink equals one 12 oz bottle of beer (355 mL), one 5 oz glass of wine (148 mL), or one 1 oz glass of hard liquor (44 mL). Lifestyle  Take daily care of your teeth and gums.  Stay active. Exercise for at least 30 minutes on 5 or more days each week.  Do not use any products that contain nicotine or tobacco, such as cigarettes, e-cigarettes, and chewing tobacco. If you need help quitting, ask your health care provider.  If you are sexually active,  practice safe sex. Use a condom or other form of birth control (contraception) in order to prevent pregnancy and STIs (sexually transmitted infections).  If told by your health care provider, take low-dose aspirin daily starting at age 29. What's next?  Visit your health care provider once a year for a well check visit.  Ask your health care provider how often you should have your eyes and teeth checked.  Stay up to date on all vaccines. This information is not intended to replace advice given to you by your health care provider. Make sure you discuss any questions you have with your health care provider. Document Revised: 07/08/2018 Document Reviewed: 07/08/2018 Elsevier Patient Education  2020 [employer].

## 2020-09-27 NOTE — Assessment & Plan Note (Signed)
Declines Shingrix as she received her Covid-19 booster recently. Other vaccines UTD. Mammogram due and scheduled for December 2021. Colonoscopy UTD, due in 2024.  Discussed the importance of a healthy diet and regular exercise in order for weight loss, and to reduce the risk of any potential medical problems.  Exam today as noted. Labs reviewed.

## 2020-09-27 NOTE — Progress Notes (Signed)
Subjective:    Patient ID: Monica Daniel, female    DOB: 1966/01/10, 54 y.o.   MRN: [id number]  HPI  This visit occurred during the SARS-CoV-2 public health emergency.  Safety protocols were in place, including screening questions prior to the visit, additional usage of staff PPE, and extensive cleaning of exam room while observing appropriate contact time as indicated for disinfecting solutions.   Monica Daniel is a 54 year old female who presents today for complete physical.  She continues to struggle with joint pain and stiffness during the day and evening. Sits for most of her day at work. Mostly with joint pain to elbows, lower back, hips, knees. She's tried Cymbalta without improvement. She was once managed on Tramadol but this was discouraged and discontinued given risk of dependence and her daily use.  She has never tried gabapentin.   Immunizations: -Tetanus: Completed in 2018 -Influenza: Completed this season  -Shingles: Declines  -Pneumonia: Completed last in 2019 -Covid-19: Completed series   Diet: She endorses a fair diet.  Exercise: No regular exercise.   Eye exam: Completed recently  Dental exam: Completes semi-annually   Pap Smear: Hysterectomy  Mammogram: Scheduled for December 2021 Colonoscopy: Completed colonoscopy in 2019, due in 2024  BP Readings from Last 3 Encounters:  09/27/20 118/64  09/15/19 123/78  03/17/19 116/72     Review of Systems  Constitutional: Negative for unexpected weight change.  HENT: Negative for rhinorrhea.   Eyes: Negative for pain.  Respiratory: Negative for cough and shortness of breath.   Cardiovascular: Negative for chest pain.  Gastrointestinal: Negative for constipation and diarrhea.  Genitourinary: Negative for difficulty urinating.  Musculoskeletal: Positive for arthralgias. Negative for myalgias.  Skin: Negative for rash.  Allergic/Immunologic: Negative for environmental allergies.  Neurological: Negative for  dizziness, numbness and headaches.  Psychiatric/Behavioral: Positive for sleep disturbance. The patient is nervous/anxious.        Past Medical History:  Diagnosis Date  . Allergy   . Arthritis   . Cancer ([place]) 2011   basal cell on lip  . Chickenpox   . Glaucoma    no meds  . Hyperlipidemia   . Seizures ([place])    as a child/ last one 5-59 years old  . Torus palatinus      Social History   Socioeconomic History  . Marital status: Married    Spouse name: Not on file  . Number of children: Not on file  . Years of education: Not on file  . Highest education level: Not on file  Occupational History  . Not on file  Tobacco Use  . Smoking status: Current Every Day Smoker    Packs/day: 0.50    Types: Cigarettes  . Smokeless tobacco: Never Used  Substance and Sexual Activity  . Alcohol use: Yes    Alcohol/week: 0.0 standard drinks    Comment: rarely  . Drug use: Not on file  . Sexual activity: Not on file  Other Topics Concern  . Not on file  Social History Narrative   Married.   2 children.   Works doing Radiographer, therapeutic.   Enjoys going to [employer], fishing, gardening.   Social Determinants of Health   Financial Resource Strain:   . Difficulty of Paying Living Expenses: Not on file  Food Insecurity:   . Worried About Charity fundraiser in the Last Year: Not on file  . Ran Out of Food in the Last Year: Not on file  Transportation  Needs:   . Lack of Transportation (Medical): Not on file  . Lack of Transportation (Non-Medical): Not on file  Physical Activity:   . Days of Exercise per Week: Not on file  . Minutes of Exercise per Session: Not on file  Stress:   . Feeling of Stress : Not on file  Social Connections:   . Frequency of Communication with Friends and Family: Not on file  . Frequency of Social Gatherings with Friends and Family: Not on file  . Attends Religious Services: Not on file  . Active Member of Clubs or Organizations: Not on file  .  Attends Archivist Meetings: Not on file  . Marital Status: Not on file  Intimate Partner Violence:   . Fear of Current or Ex-Partner: Not on file  . Emotionally Abused: Not on file  . Physically Abused: Not on file  . Sexually Abused: Not on file    Past Surgical History:  Procedure Laterality Date  . ABDOMINAL HYSTERECTOMY  2002   per pt was vaginal hysterectomy  . [place]   left  . REDUCTION MAMMAPLASTY Bilateral 20+yrs ago   cut under armpit are/ not full reduction  . TUBAL LIGATION  1991    Family History  Problem Relation Age of Onset  . Arthritis Father   . Lung cancer Father   . Hypertension Sister   . Pancreatic cancer Mother   . Diabetes Mother   . Diabetes Maternal Aunt     Allergies  Allergen Reactions  . Lac Bovis Nausea And Vomiting  . Penicillin G Rash    Current Outpatient Medications on File Prior to Visit  Medication Sig Dispense Refill  . glipiZIDE (GLUCOTROL XL) 5 MG 24 hr tablet TAKE 1 TABLET (5 MG TOTAL) BY MOUTH DAILY WITH BREAKFAST. FOR DIABETES. 90 tablet 1  . lisinopril (ZESTRIL) 10 MG tablet TAKE 1 TABLET BY MOUTH EVERY DAY 90 tablet 1  . Multiple Vitamin (MULTIVITAMIN ADULT PO) Take by mouth daily.    . Omega-3 Fatty Acids (FISH OIL PO) Take 500 mg by mouth daily.    . rosuvastatin (CRESTOR) 5 MG tablet TAKE 1 TABLET BY MOUTH EVERY DAY AT BEDTIME FOR CHOLESTEROL 90 tablet 1  . vitamin B-12 (CYANOCOBALAMIN) 250 MCG tablet Take 250 mcg by mouth daily.    . vitamin E 400 UNIT capsule Take 400 Units by mouth daily.     Current Facility-Administered Medications on File Prior to Visit  Medication Dose Route Frequency Provider Last Rate Last Admin  . 0.9 %  sodium chloride infusion  500 mL Intravenous Once Ladene Artist, MD        BP 118/64   Pulse 74   Temp 98.2 F (36.8 C) (Temporal)   Ht 5' (1.524 m)   Wt 135 lb (61.2 kg)   SpO2 98%   BMI 26.37 kg/m    Objective:   Physical Exam HENT:     Right  Ear: Tympanic membrane and ear canal normal.     Left Ear: Tympanic membrane and ear canal normal.  Eyes:     Pupils: Pupils are equal, round, and reactive to light.  Cardiovascular:     Rate and Rhythm: Normal rate and regular rhythm.  Pulmonary:     Effort: Pulmonary effort is normal.     Breath sounds: Normal breath sounds.  Abdominal:     General: Bowel sounds are normal.     Palpations: Abdomen is soft.  Tenderness: There is no abdominal tenderness.  Musculoskeletal:     Cervical back: Neck supple.     Comments: Generalized decrease in ROM to lumbar spine. Ambulates in office without instability.  Skin:    General: Skin is warm and dry.  Neurological:     Mental Status: She is alert and oriented to person, place, and time.     Cranial Nerves: No cranial nerve deficit.     Deep Tendon Reflexes:     Reflex Scores:      Patellar reflexes are 2+ on the right side and 2+ on the left side. Psychiatric:        Mood and Affect: Mood normal.            Assessment & Plan:

## 2020-09-27 NOTE — Assessment & Plan Note (Signed)
Trial of gabapentin provided today.  She will update.

## 2020-09-28 ENCOUNTER — Encounter: Payer: Self-pay | Admitting: Primary Care

## 2020-10-01 ENCOUNTER — Encounter: Payer: Self-pay | Admitting: Primary Care

## 2020-10-20 ENCOUNTER — Other Ambulatory Visit: Payer: Self-pay | Admitting: Primary Care

## 2020-10-20 DIAGNOSIS — M545 Low back pain, unspecified: Secondary | ICD-10-CM

## 2020-10-20 DIAGNOSIS — M1990 Unspecified osteoarthritis, unspecified site: Secondary | ICD-10-CM

## 2020-10-20 DIAGNOSIS — G8929 Other chronic pain: Secondary | ICD-10-CM

## 2020-10-31 ENCOUNTER — Ambulatory Visit
Admission: RE | Admit: 2020-10-31 | Discharge: 2020-10-31 | Disposition: A | Discharge status: Home / Self Care | Payer: BC Managed Care – PPO | Source: Ambulatory Visit | Attending: Primary Care | Admitting: Primary Care

## 2020-10-31 ENCOUNTER — Other Ambulatory Visit: Payer: Self-pay | Admitting: Primary Care

## 2020-10-31 ENCOUNTER — Other Ambulatory Visit: Payer: Self-pay

## 2020-10-31 DIAGNOSIS — N644 Mastodynia: Secondary | ICD-10-CM

## 2020-10-31 DIAGNOSIS — Z1231 Encounter for screening mammogram for malignant neoplasm of breast: Secondary | ICD-10-CM | POA: Insufficient documentation

## 2020-11-14 ENCOUNTER — Other Ambulatory Visit: Payer: Self-pay | Admitting: Primary Care

## 2020-11-14 DIAGNOSIS — N6452 Nipple discharge: Secondary | ICD-10-CM

## 2020-11-14 DIAGNOSIS — Z1231 Encounter for screening mammogram for malignant neoplasm of breast: Secondary | ICD-10-CM

## 2020-11-29 ENCOUNTER — Other Ambulatory Visit: Payer: Self-pay | Admitting: Primary Care

## 2020-11-29 DIAGNOSIS — I10 Essential (primary) hypertension: Secondary | ICD-10-CM

## 2020-12-13 ENCOUNTER — Ambulatory Visit
Admission: RE | Admit: 2020-12-13 | Discharge: 2020-12-13 | Disposition: A | Discharge status: Home / Self Care | Payer: BC Managed Care – PPO | Source: Ambulatory Visit | Attending: Primary Care | Admitting: Primary Care

## 2020-12-13 ENCOUNTER — Other Ambulatory Visit: Payer: Self-pay

## 2020-12-13 DIAGNOSIS — R922 Inconclusive mammogram: Secondary | ICD-10-CM | POA: Diagnosis not present

## 2020-12-13 DIAGNOSIS — Z1231 Encounter for screening mammogram for malignant neoplasm of breast: Secondary | ICD-10-CM

## 2020-12-13 DIAGNOSIS — N6452 Nipple discharge: Secondary | ICD-10-CM | POA: Diagnosis not present

## 2020-12-28 ENCOUNTER — Other Ambulatory Visit: Payer: Self-pay

## 2020-12-28 DIAGNOSIS — G4700 Insomnia, unspecified: Secondary | ICD-10-CM

## 2020-12-28 NOTE — Telephone Encounter (Signed)
LAST APPOINTMENT DATE: 09/27/2020   NEXT APPOINTMENT DATE: 03/28/2021    LAST REFILL: 09/27/2020 QTY:90

## 2021-01-01 ENCOUNTER — Other Ambulatory Visit: Payer: Self-pay

## 2021-01-01 ENCOUNTER — Other Ambulatory Visit: Payer: Self-pay | Admitting: Primary Care

## 2021-01-01 DIAGNOSIS — G4700 Insomnia, unspecified: Secondary | ICD-10-CM

## 2021-01-01 NOTE — Telephone Encounter (Signed)
LAST APPOINTMENT DATE 09/27/2020   NEXT APPOINTMENT DATE: 03/28/2021    LAST REFILL: 09/27/2020  QTY: 90

## 2021-01-02 MED ORDER — ZOLPIDEM TARTRATE 10 MG PO TABS: ORAL_TABLET | ORAL | 0 refills | Status: DC

## 2021-01-02 NOTE — Telephone Encounter (Signed)
Refills sent to pharmacy. 

## 2021-02-05 DIAGNOSIS — H40053 Ocular hypertension, bilateral: Secondary | ICD-10-CM | POA: Diagnosis not present

## 2021-02-05 LAB — HM DIABETES EYE EXAM

## 2021-02-07 ENCOUNTER — Encounter: Payer: Self-pay | Admitting: Primary Care

## 2021-03-28 ENCOUNTER — Ambulatory Visit: Payer: BC Managed Care – PPO | Admitting: Primary Care

## 2021-04-04 ENCOUNTER — Other Ambulatory Visit: Payer: Self-pay | Admitting: Primary Care

## 2021-04-04 DIAGNOSIS — G4700 Insomnia, unspecified: Secondary | ICD-10-CM

## 2021-04-25 ENCOUNTER — Other Ambulatory Visit: Payer: Self-pay

## 2021-04-25 ENCOUNTER — Ambulatory Visit: Payer: BC Managed Care – PPO | Admitting: Primary Care

## 2021-04-25 ENCOUNTER — Encounter: Payer: Self-pay | Admitting: Primary Care

## 2021-04-25 VITALS — BP 120/68 | HR 72 | Temp 98.1°F | Ht 60.0 in | Wt 138.0 lb

## 2021-04-25 DIAGNOSIS — E119 Type 2 diabetes mellitus without complications: Secondary | ICD-10-CM

## 2021-04-25 DIAGNOSIS — E785 Hyperlipidemia, unspecified: Secondary | ICD-10-CM | POA: Diagnosis not present

## 2021-04-25 DIAGNOSIS — F411 Generalized anxiety disorder: Secondary | ICD-10-CM | POA: Diagnosis not present

## 2021-04-25 DIAGNOSIS — I10 Essential (primary) hypertension: Secondary | ICD-10-CM | POA: Diagnosis not present

## 2021-04-25 DIAGNOSIS — M545 Low back pain, unspecified: Secondary | ICD-10-CM

## 2021-04-25 DIAGNOSIS — M1990 Unspecified osteoarthritis, unspecified site: Secondary | ICD-10-CM

## 2021-04-25 DIAGNOSIS — G8929 Other chronic pain: Secondary | ICD-10-CM

## 2021-04-25 LAB — POCT GLYCOSYLATED HEMOGLOBIN (HGB A1C): Hemoglobin A1C: 6.1 % — AB (ref 4.0–5.6)

## 2021-04-25 MED ORDER — GABAPENTIN 100 MG PO CAPS: 100.0000 mg | ORAL_CAPSULE | Freq: Two times a day (BID) | ORAL | 0 refills | Status: DC | PRN

## 2021-04-25 MED ORDER — ROSUVASTATIN CALCIUM 5 MG PO TABS: 5.0000 mg | ORAL_TABLET | Freq: Every day | ORAL | 3 refills | Status: DC

## 2021-04-25 MED ORDER — LISINOPRIL 10 MG PO TABS: 1.0000 | ORAL_TABLET | Freq: Every day | ORAL | 3 refills | Status: DC

## 2021-04-25 MED ORDER — SERTRALINE HCL 25 MG PO TABS: 25.0000 mg | ORAL_TABLET | Freq: Every day | ORAL | 1 refills | Status: DC

## 2021-04-25 NOTE — Assessment & Plan Note (Signed)
Improved with A1C today of 6.1.  She does seem to be experiencing side effects from Glipizide XL 5 mg and is not taking several times weekly.  Stop Glipizide XL 5 mg. She will monitor glucose levels and update if readings are at or above 130.  Foot exam today. Managed on statin and ACE-I.  Follow up in 6 months.

## 2021-04-25 NOTE — Patient Instructions (Signed)
Stop taking glipizide XL 5 mg for diabetes.  Start checking your blood sugar levels a few times weekly.  Appropriate times to check your blood sugar levels are:  -Before any meal (breakfast, lunch, dinner) -Two hours after any meal (breakfast, lunch, dinner) -Bedtime  Record your readings and notify me if you continue to consistently run at or above 130   We initiated sertraline (Zoloft) 25 mg daily for anxiety and depression. Start by taking 1/2 tablet daily for a few days, then increase to 1 full tablet thereafter.  Please schedule a follow up visit for 6 weeks for follow up of anxiety/depression.  It was a pleasure to see you today!

## 2021-04-25 NOTE — Assessment & Plan Note (Signed)
Chronic for years, uncontrollable now. Ready for treatment.  Referral placed for therapy. Rx for zoloft 25 mg sent to pharmacy. Patient is to take 1/2 tablet daily for 8 days, then advance to 1 full tablet thereafter. We discussed possible side effects of headache, GI upset, drowsiness, and SI/HI. If thoughts of SI/HI develop, we discussed to present to the emergency immediately. Patient verbalized understanding.   Follow up in 6 weeks for re-evaluation.

## 2021-04-25 NOTE — Assessment & Plan Note (Signed)
Doing well on gabapentin 100-200 mg once to twice daily as needed. Refills provided.

## 2021-04-25 NOTE — Progress Notes (Signed)
Subjective:    Patient ID: Monica Daniel, female    DOB: Mar 23, 1966, 55 y.o.   MRN: [id number]  HPI  Monica Daniel is a very pleasant 55 y.o. female with a history of type 2 diabetes, hypertension, tobacco abuse who presents today for follow up of diabetes and to discuss anxiety. She is also needing some medication refills.   1) Type 2 Diabetes:  Current medications include: Glipizide XL 5 mg. She will not take Glipizide when traveling, which is frequently, due to drops in glucose, but she doesn't check. Symptoms include feeling "shaky", feels better when eating.   She is checking her blood glucose 0 times daily.   Last A1C: 6.7 in November 2021 Last Eye Exam: UTD Last Foot Exam: Due Pneumonia Vaccination: 2019 Urine Microalbumin: None. On Ace-I Statin: Crestor  Dietary changes since last visit: None.    Exercise: Increased activity, outdoors.    2) Anxiety: History of insomnia and is managed on Zolpidem 10 mg, has been on this regimen for numerous years. Symptoms include feeling anxious, feeling panicked, feeling nervous. She really noticed this increase in anxiety on a recent trip to [place], frequent panic attacks. Yesterday she had to leave work early.   She endorses daily worry, thinking worst case scenario, difficulty controlling worry. Chronic for years, worse over the last one year. Historically she's able to manage her anxiety but no longer feels this way, she feels like her anxiety controls her.    Review of Systems  Eyes:  Negative for visual disturbance.  Respiratory:  Negative for shortness of breath.   Cardiovascular:  Negative for chest pain.  Neurological:  Negative for numbness.  Psychiatric/Behavioral:  The patient is nervous/anxious.        See HPI        Past Medical History:  Diagnosis Date   Allergy    Arthritis    Cancer ([place]) 2011   basal cell on lip   Chickenpox    Glaucoma    no meds   Hyperlipidemia    Seizures ([facility])    as a  child/ last one 66-42 years old   Torus palatinus     Social History   Socioeconomic History   Marital status: Married    Spouse name: Not on file   Number of children: Not on file   Years of education: Not on file   Highest education level: Not on file  Occupational History   Not on file  Tobacco Use   Smoking status: Every Day    Packs/day: 0.50    Pack years: 0.00    Types: Cigarettes   Smokeless tobacco: Never  Substance and Sexual Activity   Alcohol use: Yes    Alcohol/week: 0.0 standard drinks    Comment: rarely   Drug use: Not on file   Sexual activity: Not on file  Other Topics Concern   Not on file  Social History Narrative   Married.   2 children.   Works doing Radiographer, therapeutic.   Enjoys going to [employer], fishing, gardening.   Social Determinants of Health   Financial Resource Strain: Not on file  Food Insecurity: Not on file  Transportation Needs: Not on file  Physical Activity: Not on file  Stress: Not on file  Social Connections: Not on file  Intimate Partner Violence: Not on file    Past Surgical History:  Procedure Laterality Date   ABDOMINAL HYSTERECTOMY  2002   per pt was  vaginal hysterectomy   CARPAL TUNNEL RELEASE  1996   left   REDUCTION MAMMAPLASTY Bilateral 20+yrs ago   cut under armpit are/ not full reduction   TUBAL LIGATION  1991    Family History  Problem Relation Age of Onset   Arthritis Father    Lung cancer Father    Hypertension Sister    Pancreatic cancer Mother    Diabetes Mother    Diabetes Maternal Aunt     Allergies  Allergen Reactions   Lac Bovis Nausea And Vomiting   Penicillin G Rash    Current Outpatient Medications on File Prior to Visit  Medication Sig Dispense Refill   gabapentin (NEURONTIN) 100 MG capsule TAKE 1 OR 2 CAPSULES BY MOUTH TWICE DAILY FOR PAIN. 60 capsule 3   glipiZIDE (GLUCOTROL XL) 5 MG 24 hr tablet TAKE 1 TABLET (5 MG TOTAL) BY MOUTH DAILY WITH BREAKFAST. FOR DIABETES. 90 tablet 1    lisinopril (ZESTRIL) 10 MG tablet TAKE 1 TABLET BY MOUTH EVERY DAY 90 tablet 1   Multiple Vitamin (MULTIVITAMIN ADULT PO) Take by mouth daily.     Omega-3 Fatty Acids (FISH OIL PO) Take 500 mg by mouth daily.     rosuvastatin (CRESTOR) 5 MG tablet TAKE 1 TABLET BY MOUTH EVERY DAY AT BEDTIME FOR CHOLESTEROL 90 tablet 1   vitamin B-12 (CYANOCOBALAMIN) 250 MCG tablet Take 250 mcg by mouth daily.     vitamin E 400 UNIT capsule Take 400 Units by mouth daily.     zolpidem (AMBIEN) 10 MG tablet TAKE 1 TABLET BY MOUTH EVERY DAY AT BEDTIME AS NEEDED FOR SLEEP 90 tablet 0   Current Facility-Administered Medications on File Prior to Visit  Medication Dose Route Frequency Provider Last Rate Last Admin   0.9 %  sodium chloride infusion  500 mL Intravenous Once Ladene Artist, MD        BP 120/68   Pulse 72   Temp 98.1 F (36.7 C) (Temporal)   Ht 5' (1.524 m)   Wt 138 lb (62.6 kg)   SpO2 100%   BMI 26.95 kg/m  Objective:   Physical Exam Cardiovascular:     Rate and Rhythm: Normal rate and regular rhythm.  Pulmonary:     Effort: Pulmonary effort is normal.     Breath sounds: Normal breath sounds.  Musculoskeletal:     Cervical back: Neck supple.  Skin:    General: Skin is warm and dry.  Psychiatric:        Mood and Affect: Mood normal.     Comments: Tearful at times during visit          Assessment & Plan:      This visit occurred during the SARS-CoV-2 public health emergency.  Safety protocols were in place, including screening questions prior to the visit, additional usage of staff PPE, and extensive cleaning of exam room while observing appropriate contact time as indicated for disinfecting solutions.

## 2021-05-17 ENCOUNTER — Other Ambulatory Visit: Payer: Self-pay | Admitting: Primary Care

## 2021-05-17 DIAGNOSIS — F411 Generalized anxiety disorder: Secondary | ICD-10-CM

## 2021-05-24 ENCOUNTER — Other Ambulatory Visit: Payer: Self-pay | Admitting: Primary Care

## 2021-05-24 DIAGNOSIS — I10 Essential (primary) hypertension: Secondary | ICD-10-CM

## 2021-06-06 ENCOUNTER — Telehealth: Payer: BC Managed Care – PPO | Admitting: Primary Care

## 2021-07-03 ENCOUNTER — Other Ambulatory Visit: Payer: Self-pay | Admitting: Primary Care

## 2021-07-03 DIAGNOSIS — G4700 Insomnia, unspecified: Secondary | ICD-10-CM

## 2021-10-07 ENCOUNTER — Other Ambulatory Visit: Payer: Self-pay | Admitting: Primary Care

## 2021-10-07 DIAGNOSIS — G4700 Insomnia, unspecified: Secondary | ICD-10-CM

## 2021-10-17 ENCOUNTER — Telehealth (INDEPENDENT_AMBULATORY_CARE_PROVIDER_SITE_OTHER): Payer: BC Managed Care – PPO | Admitting: Primary Care

## 2021-10-17 ENCOUNTER — Encounter: Payer: Self-pay | Admitting: Primary Care

## 2021-10-17 ENCOUNTER — Other Ambulatory Visit: Payer: Self-pay

## 2021-10-17 VITALS — Ht 60.0 in | Wt 138.0 lb

## 2021-10-17 DIAGNOSIS — M1990 Unspecified osteoarthritis, unspecified site: Secondary | ICD-10-CM

## 2021-10-17 DIAGNOSIS — F411 Generalized anxiety disorder: Secondary | ICD-10-CM | POA: Diagnosis not present

## 2021-10-17 DIAGNOSIS — E785 Hyperlipidemia, unspecified: Secondary | ICD-10-CM | POA: Diagnosis not present

## 2021-10-17 DIAGNOSIS — G4700 Insomnia, unspecified: Secondary | ICD-10-CM

## 2021-10-17 DIAGNOSIS — I10 Essential (primary) hypertension: Secondary | ICD-10-CM

## 2021-10-17 DIAGNOSIS — E119 Type 2 diabetes mellitus without complications: Secondary | ICD-10-CM

## 2021-10-17 MED ORDER — ZOLPIDEM TARTRATE 10 MG PO TABS: ORAL_TABLET | ORAL | 0 refills | Status: DC

## 2021-10-17 NOTE — Assessment & Plan Note (Signed)
Stable. Doing well on PRN gabapentin 100 mg or Tylenol PRN. Continue same.

## 2021-10-17 NOTE — Assessment & Plan Note (Signed)
Home BP readings stable and controlled.  Continue lisinopril 10 mg daily. Repeat CMP pending.

## 2021-10-17 NOTE — Progress Notes (Signed)
Patient ID: Monica Daniel, female    DOB: November 30, 1965, 55 y.o.   MRN: [id number]  Virtual visit completed through [employer], a video enabled telemedicine application. Due to national recommendations of social distancing due to COVID-19, a virtual visit is felt to be most appropriate for this patient at this time. Reviewed limitations, risks, security and privacy concerns of performing a virtual visit and the availability of in person appointments. I also reviewed that there may be a patient responsible charge related to this service. The patient agreed to proceed.   Patient location: home Provider location: [place] at [facility], office Persons participating in this virtual visit: patient, provider   If any vitals were documented, they were collected by patient at home unless specified below.    Ht 5' (1.524 m)   Wt 138 lb (62.6 kg)   BMI 26.95 kg/m    CC: Medication Refills Subjective:   HPI: Monica Daniel is a 55 y.o. female presenting on 10/17/2021 for Insomnia (Needed to have follow up for refills. ) and Hypertension (Home readings are around 125/80. She checks one every few weeks and if having symptoms. )  1) Insomnia: Currently managed on Ambien 10 mg for which she takes every night. Doing well on this regimen, is needing refills.  2) Essential Hypertension: Currently managed on lisinopril 10 mg. She is checking BP on occasion which is running 120's/80's. She denies chest pain, dizziness, headaches.   3) GAD: Chronic, significant improvement since June 2022. She took Zoloft 25 mg for a brief period of time but could stopped as she could not tolerate. She never connected with therapy. A few weeks after our visit in June 2022 "life started to calm down" and has been that way since.   She denies concerns for anxiety or depression today.      Relevant past medical, surgical, family and social history reviewed and updated as indicated. Interim medical history since our last  visit reviewed. Allergies and medications reviewed and updated. Outpatient Medications Prior to Visit  Medication Sig Dispense Refill   gabapentin (NEURONTIN) 100 MG capsule Take 1-2 capsules (100-200 mg total) by mouth 2 (two) times daily as needed (pain). (Patient taking differently: Take 100-200 mg by mouth 2 (two) times daily as needed (pain). Only taking twice a week once a day) 60 capsule 0   lisinopril (ZESTRIL) 10 MG tablet Take 1 tablet (10 mg total) by mouth daily. For blood pressure. 90 tablet 3   Multiple Vitamin (MULTIVITAMIN ADULT PO) Take by mouth daily.     Omega-3 Fatty Acids (FISH OIL PO) Take 500 mg by mouth daily.     rosuvastatin (CRESTOR) 5 MG tablet Take 1 tablet (5 mg total) by mouth daily. For cholesterol 90 tablet 3   vitamin B-12 (CYANOCOBALAMIN) 250 MCG tablet Take 250 mcg by mouth daily.     vitamin E 400 UNIT capsule Take 400 Units by mouth daily.     zolpidem (AMBIEN) 10 MG tablet TAKE 1 TABLET BY MOUTH EVERY DAY AT BEDTIME AS NEEDED FOR SLEEP. Office visit required for further refills. 30 tablet 0   sertraline (ZOLOFT) 25 MG tablet Take 1 tablet (25 mg total) by mouth daily. For anxiety. (Patient not taking: Reported on 10/17/2021) 30 tablet 1   Facility-Administered Medications Prior to Visit  Medication Dose Route Frequency Provider Last Rate Last Admin   0.9 %  sodium chloride infusion  500 mL Intravenous Once Ladene Artist, MD  Per HPI unless specifically indicated in ROS section below Review of Systems  Respiratory:  Negative for shortness of breath.   Cardiovascular:  Negative for chest pain.  Neurological:  Negative for dizziness and headaches.  Psychiatric/Behavioral:  Negative for sleep disturbance. The patient is not nervous/anxious.   Objective:  Ht 5' (1.524 m)   Wt 138 lb (62.6 kg)   BMI 26.95 kg/m   Wt Readings from Last 3 Encounters:  10/17/21 138 lb (62.6 kg)  04/25/21 138 lb (62.6 kg)  09/27/20 135 lb (61.2 kg)        Physical exam: General: Alert and oriented x 3, no distress, does not appear sickly  Pulmonary: Speaks in complete sentences without increased work of breathing, no cough during visit.  Psychiatric: Normal mood, thought content, and behavior.     Results for orders placed or performed in visit on 04/25/21  POCT glycosylated hemoglobin (Hb A1C)  Result Value Ref Range   Hemoglobin A1C 6.1 (A) 4.0 - 5.6 %   HbA1c POC (<> result, manual entry)     HbA1c, POC (prediabetic range)     HbA1c, POC (controlled diabetic range)     Assessment & Plan:   Problem List Items Addressed This Visit       Cardiovascular and Mediastinum   Essential hypertension    Home BP readings stable and controlled.  Continue lisinopril 10 mg daily. Repeat CMP pending.      Relevant Orders   Comprehensive metabolic panel   CBC     Endocrine   Type 2 diabetes mellitus ([place])    Repeat A1C pending.  Continue off medications. Managed on statin and ACE-I. Foot exam UTD.        Relevant Orders   Hemoglobin A1c     Musculoskeletal and Integument   Arthritis    Stable. Doing well on PRN gabapentin 100 mg or Tylenol PRN. Continue same.         Other   Insomnia    Doing well on Ambien 10 mg, continue same. Refills sent to pharmacy.      Relevant Medications   zolpidem (AMBIEN) 10 MG tablet   GAD (generalized anxiety disorder)    Resolved.  Will discontinue Zoloft from medication list. Continue to monitor.       Hyperlipidemia - Primary    Compliant to rosuvastatin 5 mg, continue same.  Repeat lipid panel pending.      Relevant Orders   Lipid panel     Meds ordered this encounter  Medications   zolpidem (AMBIEN) 10 MG tablet    Sig: TAKE 1 TABLET BY MOUTH EVERY DAY AT BEDTIME AS NEEDED FOR SLEEP. Office visit required for further refills.    Dispense:  90 tablet    Refill:  0    Not to exceed 4 additional fills before 12/31/2021    Order Specific Question:   Supervising  Provider    Answer:   Diona Browner AMY E [[id number]]   Orders Placed This Encounter  Procedures   Lipid panel    Standing Status:   Future    Standing Expiration Date:   10/17/2022   Hemoglobin A1c    Standing Status:   Future    Standing Expiration Date:   10/17/2022   Comprehensive metabolic panel    Standing Status:   Future    Standing Expiration Date:   10/17/2022   CBC    Standing Status:   Future    Standing Expiration Date:  10/17/2022    I discussed the assessment and treatment plan with the patient. The patient was provided an opportunity to ask questions and all were answered. The patient agreed with the plan and demonstrated an understanding of the instructions. The patient was advised to call back or seek an in-person evaluation if the symptoms worsen or if the condition fails to improve as anticipated.  Follow up plan:  Complete your labs as discussed.  It was a pleasure to see you today!   Pleas Koch, NP

## 2021-10-17 NOTE — Patient Instructions (Signed)
Complete your labs as discussed.  It was a pleasure to see you today!

## 2021-10-17 NOTE — Assessment & Plan Note (Signed)
Doing well on Ambien 10 mg, continue same. Refills sent to pharmacy.

## 2021-10-17 NOTE — Assessment & Plan Note (Signed)
Repeat A1C pending.  Continue off medications. Managed on statin and ACE-I. Foot exam UTD.

## 2021-10-17 NOTE — Assessment & Plan Note (Signed)
Resolved.  Will discontinue Zoloft from medication list. Continue to monitor.

## 2021-10-17 NOTE — Assessment & Plan Note (Signed)
Compliant to rosuvastatin 5 mg, continue same.  Repeat lipid panel pending.

## 2021-10-23 ENCOUNTER — Other Ambulatory Visit: Payer: BC Managed Care – PPO

## 2021-10-30 ENCOUNTER — Other Ambulatory Visit: Payer: Self-pay

## 2021-10-30 ENCOUNTER — Other Ambulatory Visit (INDEPENDENT_AMBULATORY_CARE_PROVIDER_SITE_OTHER): Payer: BC Managed Care – PPO

## 2021-10-30 DIAGNOSIS — E785 Hyperlipidemia, unspecified: Secondary | ICD-10-CM

## 2021-10-30 DIAGNOSIS — I10 Essential (primary) hypertension: Secondary | ICD-10-CM

## 2021-10-30 DIAGNOSIS — E119 Type 2 diabetes mellitus without complications: Secondary | ICD-10-CM

## 2021-10-31 ENCOUNTER — Other Ambulatory Visit: Payer: BC Managed Care – PPO

## 2021-10-31 LAB — LIPID PANEL
Cholesterol: 120 mg/dL (ref 0–200)
HDL: 48.4 mg/dL (ref >39.00)
LDL Cholesterol: 51 mg/dL (ref 0–99)
NonHDL: 71.73
Total CHOL/HDL Ratio: 2
Triglycerides: 104 mg/dL (ref 0.0–149.0)
VLDL: 20.8 mg/dL (ref 0.0–40.0)

## 2021-10-31 LAB — CBC
HCT: 40.7 % (ref 36.0–46.0)
Hemoglobin: 13.8 g/dL (ref 12.0–15.0)
MCHC: 33.9 g/dL (ref 30.0–36.0)
MCV: 94.4 fl (ref 78.0–100.0)
Platelets: 369 10*3/uL (ref 150.0–400.0)
RBC: 4.32 Mil/uL (ref 3.87–5.11)
RDW: 12.8 % (ref 11.5–15.5)
WBC: 7.5 10*3/uL (ref 4.0–10.5)

## 2021-10-31 LAB — COMPREHENSIVE METABOLIC PANEL
ALT: 24 U/L (ref 0–35)
AST: 19 U/L (ref 0–37)
Albumin: 4.5 g/dL (ref 3.5–5.2)
Alkaline Phosphatase: 81 U/L (ref 39–117)
BUN: 15 mg/dL (ref 6–23)
CO2: 27 mEq/L (ref 19–32)
Calcium: 9.6 mg/dL (ref 8.4–10.5)
Chloride: 105 mEq/L (ref 96–112)
Creatinine, Ser: 0.72 mg/dL (ref 0.40–1.20)
GFR: 94.36 mL/min (ref >60.00)
Glucose, Bld: 86 mg/dL (ref 70–99)
Potassium: 4.1 mEq/L (ref 3.5–5.1)
Sodium: 139 mEq/L (ref 135–145)
Total Bilirubin: 0.3 mg/dL (ref 0.2–1.2)
Total Protein: 6.7 g/dL (ref 6.0–8.3)

## 2021-10-31 LAB — HEMOGLOBIN A1C: Hgb A1c MFr Bld: 6.5 % (ref 4.6–6.5)

## 2021-12-31 DIAGNOSIS — M542 Cervicalgia: Secondary | ICD-10-CM | POA: Diagnosis not present

## 2022-01-16 ENCOUNTER — Other Ambulatory Visit: Payer: Self-pay

## 2022-01-16 ENCOUNTER — Ambulatory Visit (INDEPENDENT_AMBULATORY_CARE_PROVIDER_SITE_OTHER): Payer: BC Managed Care – PPO | Admitting: Primary Care

## 2022-01-16 ENCOUNTER — Encounter: Payer: Self-pay | Admitting: Primary Care

## 2022-01-16 VITALS — BP 124/84 | HR 71 | Temp 97.9°F | Resp 14 | Ht 60.0 in | Wt 140.6 lb

## 2022-01-16 DIAGNOSIS — Z1231 Encounter for screening mammogram for malignant neoplasm of breast: Secondary | ICD-10-CM

## 2022-01-16 DIAGNOSIS — Z720 Tobacco use: Secondary | ICD-10-CM

## 2022-01-16 DIAGNOSIS — M545 Low back pain, unspecified: Secondary | ICD-10-CM | POA: Diagnosis not present

## 2022-01-16 DIAGNOSIS — E785 Hyperlipidemia, unspecified: Secondary | ICD-10-CM

## 2022-01-16 DIAGNOSIS — M542 Cervicalgia: Secondary | ICD-10-CM | POA: Diagnosis not present

## 2022-01-16 DIAGNOSIS — G4700 Insomnia, unspecified: Secondary | ICD-10-CM

## 2022-01-16 DIAGNOSIS — Z0000 Encounter for general adult medical examination without abnormal findings: Secondary | ICD-10-CM

## 2022-01-16 DIAGNOSIS — I10 Essential (primary) hypertension: Secondary | ICD-10-CM | POA: Diagnosis not present

## 2022-01-16 DIAGNOSIS — Z122 Encounter for screening for malignant neoplasm of respiratory organs: Secondary | ICD-10-CM

## 2022-01-16 DIAGNOSIS — M1990 Unspecified osteoarthritis, unspecified site: Secondary | ICD-10-CM

## 2022-01-16 DIAGNOSIS — G8929 Other chronic pain: Secondary | ICD-10-CM | POA: Insufficient documentation

## 2022-01-16 DIAGNOSIS — E1165 Type 2 diabetes mellitus with hyperglycemia: Secondary | ICD-10-CM

## 2022-01-16 NOTE — Progress Notes (Signed)
? ?Subjective:  ? ? Patient ID: Monica Daniel, female    DOB: [birth year], 56 y.o.   MRN: [id number] ? ?HPI ? ?Monica Daniel is a very pleasant 56 y.o. female who presents today for complete physical and follow up of chronic conditions. ? ?Immunizations: ?-Tetanus: 2018 ?-Influenza: Completed the season ?-Covid-19: 3 vaccine ?-Shingles: Never completed, declines today ?-Pneumonia: Pneumovax in 2019 ? ?Diet: Fair diet.  ?Exercise: No regular exercise. ? ?Eye exam: Completes annually  ?Dental exam: Completes semi-annually  ? ?Pap Smear: Hysterectomy ?Mammogram: Completed in February 2022 ?Colonoscopy: Completed in 2019, due 2024 ? ?Lung Cancer Screening: Never completed. Smoking cigarettes off and on for about 30 years. Has been smoking consistently for the last 15 years. Is smoking 1/2 PPD now, has never smoked more than 1/2 PPD.  ?  ? ?BP Readings from Last 3 Encounters:  ?01/16/22 124/84  ?04/25/21 120/68  ?09/27/20 118/64  ? ? ? ?Review of Systems  ?Constitutional:  Negative for unexpected weight change.  ?HENT:  Negative for rhinorrhea.   ?Eyes:  Negative for visual disturbance.  ?Respiratory:  Negative for cough and shortness of breath.   ?Cardiovascular:  Negative for chest pain.  ?Gastrointestinal:  Negative for constipation and diarrhea.  ?Genitourinary:  Negative for difficulty urinating.  ?Musculoskeletal:  Positive for arthralgias.  ?Skin:  Negative for rash.  ?Allergic/Immunologic: Negative for environmental allergies.  ?Neurological:  Negative for dizziness, numbness and headaches.  ?Psychiatric/Behavioral:  Negative for sleep disturbance. The patient is not nervous/anxious.   ? ?   ? ? ?Past Medical History:  ?Diagnosis Date  ? Allergy   ? Arthritis   ? Cancer [facility]) 2011  ? basal cell on lip  ? Chickenpox   ? Glaucoma   ? no meds  ? Hyperlipidemia   ? Seizures ([place])   ? as a child/ last one 44-88 years old  ? Torus palatinus   ? ? ?Social History  ? ?Socioeconomic History  ? Marital status: Married   ?  Spouse name: Not on file  ? Number of children: Not on file  ? Years of education: Not on file  ? Highest education level: Not on file  ?Occupational History  ? Not on file  ?Tobacco Use  ? Smoking status: Every Day  ?  Packs/day: 0.50  ?  Types: Cigarettes  ? Smokeless tobacco: Never  ?Substance and Sexual Activity  ? Alcohol use: Yes  ?  Alcohol/week: 0.0 standard drinks  ?  Comment: rarely  ? Drug use: Not on file  ? Sexual activity: Not on file  ?Other Topics Concern  ? Not on file  ?Social History Narrative  ? Married.  ? 2 children.  ? Works doing Radiographer, therapeutic.  ? Enjoys going to [employer], fishing, gardening.  ? ?Social Determinants of Health  ? ?Financial Resource Strain: Not on file  ?Food Insecurity: Not on file  ?Transportation Needs: Not on file  ?Physical Activity: Not on file  ?Stress: Not on file  ?Social Connections: Not on file  ?Intimate Partner Violence: Not on file  ? ? ?Past Surgical History:  ?Procedure Laterality Date  ? ABDOMINAL HYSTERECTOMY  2002  ? per pt was vaginal hysterectomy  ? [place]  ? left  ? REDUCTION MAMMAPLASTY Bilateral 20+yrs ago  ? cut under armpit are/ not full reduction  ? TUBAL LIGATION  1991  ? ? ?Family History  ?Problem Relation Age of Onset  ? Arthritis Father   ?  Lung cancer Father   ? Hypertension Sister   ? Pancreatic cancer Mother   ? Diabetes Mother   ? Diabetes Maternal Aunt   ? ? ?Allergies  ?Allergen Reactions  ? Lac Bovis Nausea And Vomiting  ? Penicillin G Rash  ? ? ?Current Outpatient Medications on File Prior to Visit  ?Medication Sig Dispense Refill  ? gabapentin (NEURONTIN) 100 MG capsule Take 1-2 capsules (100-200 mg total) by mouth 2 (two) times daily as needed (pain). (Patient taking differently: Take 100-200 mg by mouth 2 (two) times daily as needed (pain). Only taking twice a week once a day) 60 capsule 0  ? lisinopril (ZESTRIL) 10 MG tablet Take 1 tablet (10 mg total) by mouth daily. For blood pressure. 90 tablet 3   ? Multiple Vitamin (MULTIVITAMIN ADULT PO) Take by mouth daily.    ? Omega-3 Fatty Acids (FISH OIL PO) Take 500 mg by mouth daily.    ? rosuvastatin (CRESTOR) 5 MG tablet Take 1 tablet (5 mg total) by mouth daily. For cholesterol 90 tablet 3  ? vitamin B-12 (CYANOCOBALAMIN) 250 MCG tablet Take 250 mcg by mouth daily.    ? vitamin E 400 UNIT capsule Take 400 Units by mouth daily.    ? zolpidem (AMBIEN) 10 MG tablet TAKE 1 TABLET BY MOUTH EVERY DAY AT BEDTIME AS NEEDED FOR SLEEP. Office visit required for further refills. 90 tablet 0  ? ?Current Facility-Administered Medications on File Prior to Visit  ?Medication Dose Route Frequency Provider Last Rate Last Admin  ? 0.9 %  sodium chloride infusion  500 mL Intravenous Once Ladene Artist, MD      ? ? ?There were no vitals taken for this visit. ?Objective:  ? Physical Exam ?HENT:  ?   Right Ear: Tympanic membrane and ear canal normal.  ?   Left Ear: Tympanic membrane and ear canal normal.  ?   Nose: Nose normal.  ?Eyes:  ?   Conjunctiva/sclera: Conjunctivae normal.  ?   Pupils: Pupils are equal, round, and reactive to light.  ?Neck:  ?   Thyroid: No thyromegaly.  ?Cardiovascular:  ?   Rate and Rhythm: Normal rate and regular rhythm.  ?   Heart sounds: No murmur heard. ?Pulmonary:  ?   Effort: Pulmonary effort is normal.  ?   Breath sounds: Normal breath sounds. No rales.  ?Abdominal:  ?   General: Bowel sounds are normal.  ?   Palpations: Abdomen is soft.  ?   Tenderness: There is no abdominal tenderness.  ?Musculoskeletal:     ?   General: Normal range of motion.  ?   Cervical back: Neck supple.  ?Lymphadenopathy:  ?   Cervical: No cervical adenopathy.  ?Skin: ?   General: Skin is warm and dry.  ?   Findings: No rash.  ?Neurological:  ?   Mental Status: She is alert and oriented to person, place, and time.  ?   Cranial Nerves: No cranial nerve deficit.  ?   Deep Tendon Reflexes: Reflexes are normal and symmetric.  ?Psychiatric:     ?   Mood and Affect: Mood normal.   ? ? ? ? ? ?   ?Assessment & Plan:  ? ? ? ? ?This visit occurred during the SARS-CoV-2 public health emergency.  Safety protocols were in place, including screening questions prior to the visit, additional usage of staff PPE, and extensive cleaning of exam room while observing appropriate contact time as indicated for disinfecting solutions.  ?

## 2022-01-16 NOTE — Assessment & Plan Note (Signed)
Controlled. ? ?Continue zolpidem 10 mg daily ?

## 2022-01-16 NOTE — Assessment & Plan Note (Signed)
A1c of 6.5 in December 2022. ? ?Intolerant to metformin, including the XR version. ?Did not feel well on glipizide. ? ?Discussed potential use of Trulicity or Ozempic at the starting dose. ? ?Repeat A1c in 1 month, consider treatment if A1c is worse. ? ?Continue statin therapy and ACE inhibitor. ?

## 2022-01-16 NOTE — Assessment & Plan Note (Signed)
Chronic and ongoing. ? ?Continue gabapentin 100-200 mg BID PRN. ?Following with orthopedics. ?

## 2022-01-16 NOTE — Assessment & Plan Note (Signed)
Following with orthopedics.  ? ?Continue to monitor.  ?

## 2022-01-16 NOTE — Assessment & Plan Note (Signed)
Chronic, ongoing. ? ?Following with orthopedics.  ? ?Using gabapentin as needed, she does notice headaches as a side effect.  ? ?Continue to monitor.  ?

## 2022-01-16 NOTE — Assessment & Plan Note (Signed)
Continues to smoke. ? ?Unsure if she qualifies for lung cancer screening program, however will place referral for their team to call her. ?

## 2022-01-16 NOTE — Assessment & Plan Note (Signed)
Controlled. ? ?Continue lisinopril 10 mg daily. ? ?CMP reviewed from December 2022. ?

## 2022-01-16 NOTE — Assessment & Plan Note (Signed)
Controlled. ?Lipid panel from December 2022 reviewed. ? ?Continue rosuvastatin 5 mg daily. ?

## 2022-01-16 NOTE — Assessment & Plan Note (Signed)
Declines Shingrix vaccine.  Other vaccines up-to-date. ?Mammogram due, orders placed. ?Colonoscopy up-to-date, due 2024. ? ?Discussed the importance of a healthy diet and regular exercise in order for weight loss, and to reduce the risk of further co-morbidity. ? ?Exam today stable. ?Labs reviewed from December 2022. ?

## 2022-01-16 NOTE — Patient Instructions (Signed)
You will be contacted regarding your referral to the lung cancer screening program.  Please let us know if you have not been contacted within two weeks.  ? ?Call the [facility] to schedule your mammogram.  ? ?Schedule a lab appointment for 1 month to repeat your diabetes test. ? ?It was a pleasure to see you today! ? ?Preventive Care 56-56 Years Old, Female ?Preventive care refers to lifestyle choices and visits with your health care provider that can promote health and wellness. Preventive care visits are also called wellness exams. ?What can I expect for my preventive care visit? ?Counseling ?Your health care provider may ask you questions about your: ?Medical history, including: ?Past medical problems. ?Family medical history. ?Pregnancy history. ?Current health, including: ?Menstrual cycle. ?Method of birth control. ?Emotional well-being. ?Home life and relationship well-being. ?Sexual activity and sexual health. ?Lifestyle, including: ?Alcohol, nicotine or tobacco, and drug use. ?Access to firearms. ?Diet, exercise, and sleep habits. ?Work and work Statistician. ?Sunscreen use. ?Safety issues such as seatbelt and bike helmet use. ?Physical exam ?Your health care provider will check your: ?Height and weight. These may be used to calculate your BMI (body mass index). BMI is a measurement that tells if you are at a healthy weight. ?Waist circumference. This measures the distance around your waistline. This measurement also tells if you are at a healthy weight and may help predict your risk of certain diseases, such as type 2 diabetes and high blood pressure. ?Heart rate and blood pressure. ?Body temperature. ?Skin for abnormal spots. ?What immunizations do I need? ?Vaccines are usually given at various ages, according to a schedule. Your health care provider will recommend vaccines for you based on your age, medical history, and lifestyle or other factors, such as travel or where you work. ?What tests do I  need? ?Screening ?Your health care provider may recommend screening tests for certain conditions. This may include: ?Lipid and cholesterol levels. ?Diabetes screening. This is done by checking your blood sugar (glucose) after you have not eaten for a while (fasting). ?Pelvic exam and Pap test. ?Hepatitis B test. ?Hepatitis C test. ?HIV (human immunodeficiency virus) test. ?STI (sexually transmitted infection) testing, if you are at risk. ?Lung cancer screening. ?Colorectal cancer screening. ?Mammogram. Talk with your health care provider about when you should start having regular mammograms. This may depend on whether you have a family history of breast cancer. ?BRCA-related cancer screening. This may be done if you have a family history of breast, ovarian, tubal, or peritoneal cancers. ?Bone density scan. This is done to screen for osteoporosis. ?Talk with your health care provider about your test results, treatment options, and if necessary, the need for more tests. ?Follow these instructions at home: ?Eating and drinking ? ?Eat a diet that includes fresh fruits and vegetables, whole grains, lean protein, and low-fat dairy products. ?Take vitamin and mineral supplements as recommended by your health care provider. ?Do not drink alcohol if: ?Your health care provider tells you not to drink. ?You are pregnant, may be pregnant, or are planning to become pregnant. ?If you drink alcohol: ?Limit how much you have to 0-1 drink a day. ?Know how much alcohol is in your drink. In the [place] one drink equals one 12 oz bottle of beer (355 mL), one 5 oz glass of wine (148 mL), or one 1? oz glass of hard liquor (44 mL). ?Lifestyle ?Brush your teeth every morning and night with fluoride toothpaste. Floss one time each day. ?Exercise for at least 30  minutes 5 or more days each week. ?Do not use any products that contain nicotine or tobacco. These products include cigarettes, chewing tobacco, and vaping devices, such as  e-cigarettes. If you need help quitting, ask your health care provider. ?Do not use drugs. ?If you are sexually active, practice safe sex. Use a condom or other form of protection to prevent STIs. ?If you do not wish to become pregnant, use a form of birth control. If you plan to become pregnant, see your health care provider for a prepregnancy visit. ?Take aspirin only as told by your health care provider. Make sure that you understand how much to take and what form to take. Work with your health care provider to find out whether it is safe and beneficial for you to take aspirin daily. ?Find healthy ways to manage stress, such as: ?Meditation, yoga, or listening to music. ?Journaling. ?Talking to a trusted person. ?Spending time with friends and family. ?Minimize exposure to UV radiation to reduce your risk of skin cancer. ?Safety ?Always wear your seat belt while driving or riding in a vehicle. ?Do not drive: ?If you have been drinking alcohol. Do not ride with someone who has been drinking. ?When you are tired or distracted. ?While texting. ?If you have been using any mind-altering substances or drugs. ?Wear a helmet and other protective equipment during sports activities. ?If you have firearms in your house, make sure you follow all gun safety procedures. ?Seek help if you have been physically or sexually abused. ?What's next? ?Visit your health care provider once a year for an annual wellness visit. ?Ask your health care provider how often you should have your eyes and teeth checked. ?Stay up to date on all vaccines. ?This information is not intended to replace advice given to you by your health care provider. Make sure you discuss any questions you have with your health care provider. ?Document Revised: 04/24/2021 Document Reviewed: 04/24/2021 ?Elsevier Patient Education ? [place]. ? ?

## 2022-02-09 ENCOUNTER — Other Ambulatory Visit: Payer: Self-pay | Admitting: Primary Care

## 2022-02-09 DIAGNOSIS — G4700 Insomnia, unspecified: Secondary | ICD-10-CM

## 2022-02-10 ENCOUNTER — Other Ambulatory Visit: Payer: Self-pay | Admitting: Primary Care

## 2022-02-10 DIAGNOSIS — E1165 Type 2 diabetes mellitus with hyperglycemia: Secondary | ICD-10-CM

## 2022-02-26 ENCOUNTER — Other Ambulatory Visit: Payer: BC Managed Care – PPO

## 2022-02-26 DIAGNOSIS — H40013 Open angle with borderline findings, low risk, bilateral: Secondary | ICD-10-CM | POA: Diagnosis not present

## 2022-03-03 ENCOUNTER — Other Ambulatory Visit (INDEPENDENT_AMBULATORY_CARE_PROVIDER_SITE_OTHER): Payer: BC Managed Care – PPO

## 2022-03-03 DIAGNOSIS — E1165 Type 2 diabetes mellitus with hyperglycemia: Secondary | ICD-10-CM

## 2022-03-03 LAB — POCT GLYCOSYLATED HEMOGLOBIN (HGB A1C): Hemoglobin A1C: 6 % — AB (ref 4.0–5.6)

## 2022-03-10 ENCOUNTER — Ambulatory Visit
Admission: RE | Admit: 2022-03-10 | Discharge: 2022-03-10 | Disposition: A | Discharge status: Home / Self Care | Payer: BC Managed Care – PPO | Source: Ambulatory Visit | Attending: Primary Care | Admitting: Primary Care

## 2022-03-10 DIAGNOSIS — Z1231 Encounter for screening mammogram for malignant neoplasm of breast: Secondary | ICD-10-CM | POA: Insufficient documentation

## 2022-04-22 ENCOUNTER — Other Ambulatory Visit: Payer: Self-pay | Admitting: Primary Care

## 2022-04-22 DIAGNOSIS — E785 Hyperlipidemia, unspecified: Secondary | ICD-10-CM

## 2022-05-20 ENCOUNTER — Other Ambulatory Visit: Payer: Self-pay | Admitting: Primary Care

## 2022-05-20 DIAGNOSIS — G4700 Insomnia, unspecified: Secondary | ICD-10-CM

## 2022-05-23 ENCOUNTER — Other Ambulatory Visit: Payer: Self-pay | Admitting: Primary Care

## 2022-05-23 DIAGNOSIS — I10 Essential (primary) hypertension: Secondary | ICD-10-CM

## 2022-08-28 ENCOUNTER — Ambulatory Visit: Payer: BC Managed Care – PPO | Admitting: Primary Care

## 2022-08-28 ENCOUNTER — Encounter: Payer: Self-pay | Admitting: Primary Care

## 2022-08-28 VITALS — BP 136/74 | HR 89 | Temp 99.3°F | Ht 60.0 in | Wt 140.0 lb

## 2022-08-28 DIAGNOSIS — E1165 Type 2 diabetes mellitus with hyperglycemia: Secondary | ICD-10-CM | POA: Diagnosis not present

## 2022-08-28 DIAGNOSIS — G4700 Insomnia, unspecified: Secondary | ICD-10-CM | POA: Diagnosis not present

## 2022-08-28 DIAGNOSIS — J3489 Other specified disorders of nose and nasal sinuses: Secondary | ICD-10-CM | POA: Diagnosis not present

## 2022-08-28 LAB — POCT GLYCOSYLATED HEMOGLOBIN (HGB A1C): Hemoglobin A1C: 6.3 % — AB (ref 4.0–5.6)

## 2022-08-28 LAB — MICROALBUMIN / CREATININE URINE RATIO
Creatinine,U: 25.3 mg/dL
Microalb Creat Ratio: 2.8 mg/g (ref 0.0–30.0)
Microalb, Ur: <0.7 mg/dL (ref 0.0–1.9)

## 2022-08-28 MED ORDER — ZOLPIDEM TARTRATE 10 MG PO TABS: 10.0000 mg | ORAL_TABLET | Freq: Every day | ORAL | 0 refills | Status: DC

## 2022-08-28 MED ORDER — AZITHROMYCIN 250 MG PO TABS: ORAL_TABLET | ORAL | 0 refills | Status: DC

## 2022-08-28 NOTE — Patient Instructions (Addendum)
Start Azithromycin antibiotics for infection. Take 2 tablets by mouth today, then 1 tablet daily for 4 additional days.  Stop by the lab prior to leaving today. I will notify you of your results once received.   Please schedule a physical to meet with me in March 2024.  It was a pleasure to see you today!

## 2022-08-28 NOTE — Progress Notes (Signed)
Subjective:    Patient ID: Monica Daniel, female    DOB: 07/23/1966, 56 y.o.   MRN: [id number]  HPI  Monica Daniel is a very pleasant 56 y.o. female with a history of type 2 diabetes, hypertension, insomnia, tobacco abuse, GAD, hyperlipidemia who presents today for follow up of diabetes and to discuss acute sinus pressure.   She is also needing a refill of her Ambien.  1) Type 2 Diabetes:  Current medications include: None  She is checking her blood glucose on occasion and is getting readings of 80's-90's  Last A1C: 6.0 in April 2023, 6.3 today Last Eye Exam: Due Last Foot Exam: Due Pneumonia Vaccination: 2019 Urine Microalbumin: Due Statin: rosuvastatin   Dietary changes since last visit: Grilled and baked foods, veggies. Good water consumption    Exercise: No regular exercise.   2) Sinus Pressure: Acute to the bilateral maxillary sinus region for the last 10 days. She was visiting in [place] shortly after symptoms began.   Overall her left maxillary pressure has improved but her right maxillary pressure is about the same and has noticed swelling. She's been using Flonase, neti pot rinses without improvement.   She denies fevers. History of sinus infections, this feels similar.   BP Readings from Last 3 Encounters:  08/28/22 136/74  01/16/22 124/84  04/25/21 120/68        Review of Systems  HENT:  Positive for congestion and sinus pressure. Negative for sore throat.   Respiratory:  Negative for shortness of breath.   Cardiovascular:  Negative for chest pain.  Allergic/Immunologic: Positive for environmental allergies.  Neurological:  Negative for dizziness and numbness.         Past Medical History:  Diagnosis Date   Allergy    Arthritis    Cancer ([place]) 2011   basal cell on lip   Chickenpox    Glaucoma    no meds   Hyperlipidemia    Seizures ([place])    as a child/ last one 39-70 years old   Torus palatinus     Social History    Socioeconomic History   Marital status: Married    Spouse name: Not on file   Number of children: Not on file   Years of education: Not on file   Highest education level: Not on file  Occupational History   Not on file  Tobacco Use   Smoking status: Every Day    Packs/day: 0.50    Types: Cigarettes   Smokeless tobacco: Never  Substance and Sexual Activity   Alcohol use: Yes    Alcohol/week: 0.0 standard drinks of alcohol    Comment: rarely   Drug use: Not on file   Sexual activity: Not on file  Other Topics Concern   Not on file  Social History Narrative   Married.   2 children.   Works doing Radiographer, therapeutic.   Enjoys going to [employer], fishing, gardening.   Social Determinants of Health   Financial Resource Strain: Not on file  Food Insecurity: Not on file  Transportation Needs: Not on file  Physical Activity: Not on file  Stress: Not on file  Social Connections: Not on file  Intimate Partner Violence: Not on file    Past Surgical History:  Procedure Laterality Date   ABDOMINAL HYSTERECTOMY  2002   per pt was vaginal hysterectomy   CARPAL TUNNEL RELEASE  1996   left   REDUCTION MAMMAPLASTY Bilateral 20+yrs ago  cut under armpit are/ not full reduction   TUBAL LIGATION  1991    Family History  Problem Relation Age of Onset   Arthritis Father    Lung cancer Father    Hypertension Sister    Pancreatic cancer Mother    Diabetes Mother    Diabetes Maternal Aunt     Allergies  Allergen Reactions   Milk (Cow) Nausea And Vomiting   Penicillin G Rash    Current Outpatient Medications on File Prior to Visit  Medication Sig Dispense Refill   lisinopril (ZESTRIL) 10 MG tablet TAKE 1 TABLET BY MOUTH EVERY DAY FOR BLOOD PRESSURE 90 tablet 1   Multiple Vitamin (MULTIVITAMIN ADULT PO) Take by mouth daily.     Omega-3 Fatty Acids (FISH OIL PO) Take 500 mg by mouth daily.     rosuvastatin (CRESTOR) 5 MG tablet TAKE 1 TABLET (5 MG TOTAL) BY MOUTH DAILY.  FOR CHOLESTEROL 90 tablet 2   vitamin B-12 (CYANOCOBALAMIN) 250 MCG tablet Take 250 mcg by mouth daily.     vitamin E 400 UNIT capsule Take 400 Units by mouth daily.     gabapentin (NEURONTIN) 100 MG capsule Take 1-2 capsules (100-200 mg total) by mouth 2 (two) times daily as needed (pain). (Patient not taking: Reported on 08/28/2022) 60 capsule 0   Current Facility-Administered Medications on File Prior to Visit  Medication Dose Route Frequency Provider Last Rate Last Admin   0.9 %  sodium chloride infusion  500 mL Intravenous Once Ladene Artist, MD        BP 136/74   Pulse 89   Temp 99.3 F (37.4 C) (Temporal)   Ht 5' (1.524 m)   Wt 140 lb (63.5 kg)   SpO2 99%   BMI 27.34 kg/m  Objective:   Physical Exam HENT:     Nose:     Right Sinus: Maxillary sinus tenderness present.     Comments: Mild swelling to right maxillary sinus region  Cardiovascular:     Rate and Rhythm: Normal rate and regular rhythm.  Pulmonary:     Effort: Pulmonary effort is normal.     Breath sounds: Normal breath sounds.  Musculoskeletal:     Cervical back: Neck supple.  Skin:    General: Skin is warm and dry.           Assessment & Plan:   Problem List Items Addressed This Visit       Endocrine   Type 2 diabetes mellitus with hyperglycemia ([place]) - Primary    Controlled with A1C today of 6.3.  Remain off treatment. Continue to work on diet. Add in regular exercise.  Foot exam today. Urine microalbumin due and pending.   Follow up in March 2024      Relevant Orders   Microalbumin/Creatinine Ratio, Urine   POCT glycosylated hemoglobin (Hb A1C)     Other   Insomnia   Relevant Medications   zolpidem (AMBIEN) 10 MG tablet   Sinus pressure    Suspicious for acute bacterial sinusitis, especially given duration of symptoms and presentation.   Start Azithromycin antibiotics for infection. Take 2 tablets by mouth today, then 1 tablet daily for 4 additional days. Allergy to  PCN.  Continue Flonase. Continue neti pot rinses.         Relevant Medications   azithromycin (ZITHROMAX) 250 MG tablet       Pleas Koch, NP

## 2022-08-28 NOTE — Assessment & Plan Note (Addendum)
Suspicious for acute bacterial sinusitis, especially given duration of symptoms and presentation.   Start Azithromycin antibiotics for infection. Take 2 tablets by mouth today, then 1 tablet daily for 4 additional days. Allergy to PCN.  Continue Flonase. Continue neti pot rinses.

## 2022-08-28 NOTE — Assessment & Plan Note (Addendum)
Controlled with A1C today of 6.3.  Remain off treatment. Continue to work on diet. Add in regular exercise.  Foot exam today. Urine microalbumin due and pending.   Follow up in March 2024

## 2022-11-22 ENCOUNTER — Other Ambulatory Visit: Payer: Self-pay | Admitting: Primary Care

## 2022-11-22 DIAGNOSIS — I10 Essential (primary) hypertension: Secondary | ICD-10-CM

## 2022-11-24 ENCOUNTER — Other Ambulatory Visit: Payer: Self-pay | Admitting: Primary Care

## 2022-11-24 DIAGNOSIS — G4700 Insomnia, unspecified: Secondary | ICD-10-CM

## 2023-01-12 DIAGNOSIS — L570 Actinic keratosis: Secondary | ICD-10-CM | POA: Diagnosis not present

## 2023-01-12 DIAGNOSIS — L820 Inflamed seborrheic keratosis: Secondary | ICD-10-CM | POA: Diagnosis not present

## 2023-01-29 ENCOUNTER — Encounter: Payer: BC Managed Care – PPO | Admitting: Primary Care

## 2023-02-17 ENCOUNTER — Other Ambulatory Visit: Payer: Self-pay | Admitting: Primary Care

## 2023-02-17 DIAGNOSIS — Z1231 Encounter for screening mammogram for malignant neoplasm of breast: Secondary | ICD-10-CM

## 2023-02-18 ENCOUNTER — Other Ambulatory Visit: Payer: Self-pay | Admitting: Primary Care

## 2023-02-18 DIAGNOSIS — I10 Essential (primary) hypertension: Secondary | ICD-10-CM

## 2023-02-19 ENCOUNTER — Ambulatory Visit (INDEPENDENT_AMBULATORY_CARE_PROVIDER_SITE_OTHER): Payer: BC Managed Care – PPO | Admitting: Primary Care

## 2023-02-19 ENCOUNTER — Encounter: Payer: Self-pay | Admitting: Primary Care

## 2023-02-19 ENCOUNTER — Other Ambulatory Visit: Payer: Self-pay | Admitting: Primary Care

## 2023-02-19 VITALS — BP 124/66 | HR 82 | Temp 98.5°F | Ht 60.0 in | Wt 142.0 lb

## 2023-02-19 DIAGNOSIS — M1990 Unspecified osteoarthritis, unspecified site: Secondary | ICD-10-CM | POA: Diagnosis not present

## 2023-02-19 DIAGNOSIS — E1165 Type 2 diabetes mellitus with hyperglycemia: Secondary | ICD-10-CM

## 2023-02-19 DIAGNOSIS — Z720 Tobacco use: Secondary | ICD-10-CM

## 2023-02-19 DIAGNOSIS — G8929 Other chronic pain: Secondary | ICD-10-CM

## 2023-02-19 DIAGNOSIS — Z0000 Encounter for general adult medical examination without abnormal findings: Secondary | ICD-10-CM

## 2023-02-19 DIAGNOSIS — H409 Unspecified glaucoma: Secondary | ICD-10-CM

## 2023-02-19 DIAGNOSIS — G4700 Insomnia, unspecified: Secondary | ICD-10-CM

## 2023-02-19 DIAGNOSIS — M545 Low back pain, unspecified: Secondary | ICD-10-CM

## 2023-02-19 DIAGNOSIS — I10 Essential (primary) hypertension: Secondary | ICD-10-CM | POA: Diagnosis not present

## 2023-02-19 DIAGNOSIS — E785 Hyperlipidemia, unspecified: Secondary | ICD-10-CM | POA: Diagnosis not present

## 2023-02-19 DIAGNOSIS — F411 Generalized anxiety disorder: Secondary | ICD-10-CM

## 2023-02-19 LAB — COMPREHENSIVE METABOLIC PANEL
ALT: 17 U/L (ref 0–35)
AST: 15 U/L (ref 0–37)
Albumin: 4.6 g/dL (ref 3.5–5.2)
Alkaline Phosphatase: 87 U/L (ref 39–117)
BUN: 22 mg/dL (ref 6–23)
CO2: 27 mEq/L (ref 19–32)
Calcium: 9.4 mg/dL (ref 8.4–10.5)
Chloride: 106 mEq/L (ref 96–112)
Creatinine, Ser: 0.71 mg/dL (ref 0.40–1.20)
GFR: 95.08 mL/min (ref >60.00)
Glucose, Bld: 161 mg/dL — ABNORMAL HIGH (ref 70–99)
Potassium: 3.6 mEq/L (ref 3.5–5.1)
Sodium: 140 mEq/L (ref 135–145)
Total Bilirubin: 0.2 mg/dL (ref 0.2–1.2)
Total Protein: 6.9 g/dL (ref 6.0–8.3)

## 2023-02-19 LAB — HEMOGLOBIN A1C: Hgb A1c MFr Bld: 6.4 % (ref 4.6–6.5)

## 2023-02-19 LAB — LIPID PANEL
Cholesterol: 135 mg/dL (ref 0–200)
HDL: 39.1 mg/dL (ref >39.00)
NonHDL: 96.14
Total CHOL/HDL Ratio: 3
Triglycerides: 263 mg/dL — ABNORMAL HIGH (ref 0.0–149.0)
VLDL: 52.6 mg/dL — ABNORMAL HIGH (ref 0.0–40.0)

## 2023-02-19 LAB — LDL CHOLESTEROL, DIRECT: Direct LDL: 67 mg/dL

## 2023-02-19 NOTE — Assessment & Plan Note (Signed)
Waxes and wanes.  Continue gabapentin 100-200 mg PRN.

## 2023-02-19 NOTE — Assessment & Plan Note (Signed)
Overall controlled.  Continue Ambien 10 mg HS for which she has been taking for years.

## 2023-02-19 NOTE — Assessment & Plan Note (Signed)
Following with ophthalmology. She will schedule an appointment.

## 2023-02-19 NOTE — Patient Instructions (Signed)
Stop by the lab prior to leaving today. I will notify you of your results once received.   You are due for your colonoscopy in July 2024.  It was a pleasure to see you today!

## 2023-02-19 NOTE — Progress Notes (Signed)
Subjective:    Patient ID: Monica Daniel, female    DOB: 03-16-1966, 57 y.o.   MRN: [id number]  HPI  Monica Daniel is a very pleasant 57 y.o. female who presents today for complete physical and follow up of chronic conditions.  Immunizations: -Tetanus: Completed in 2018 -Influenza: Completed this season -Shingles: Never completed, declines  -Pneumonia: Completed in 2019  Diet: Fair diet. She has moved away from processed foods.  Exercise: No regular exercise.  Eye exam: Completes annually  Dental exam: Completes semi-annually    Pap Smear: Hysterectomy  Mammogram: May 2023, scheduled for May 2024  Colonoscopy: Completed in 2019, due July 2024 Lung Cancer Screening: Smoking intermittently since the age of mid 77's. Smokes less than 1 PPD. Referred to lung cancer screening program last year, did not qualify.   BP Readings from Last 3 Encounters:  02/19/23 124/66  08/28/22 136/74  01/16/22 124/84   Wt Readings from Last 3 Encounters:  02/19/23 142 lb (64.4 kg)  08/28/22 140 lb (63.5 kg)  01/16/22 140 lb 9.6 oz (63.8 kg)     Review of Systems  Constitutional:  Negative for unexpected weight change.  HENT:  Negative for rhinorrhea.   Respiratory:  Negative for cough and shortness of breath.   Cardiovascular:  Negative for chest pain.  Gastrointestinal:  Negative for constipation and diarrhea.  Genitourinary:  Negative for difficulty urinating.  Musculoskeletal:  Positive for arthralgias and back pain.  Skin:  Negative for rash.  Allergic/Immunologic: Negative for environmental allergies.  Neurological:  Negative for dizziness and headaches.  Psychiatric/Behavioral:  The patient is not nervous/anxious.          Past Medical History:  Diagnosis Date   Allergy    Arthritis    Cancer 2011   basal cell on lip   Chickenpox    Glaucoma    no meds   Hyperlipidemia    Seizures    as a child/ last one 96-97 years old   Torus palatinus     Social History    Socioeconomic History   Marital status: Married    Spouse name: Not on file   Number of children: Not on file   Years of education: Not on file   Highest education level: Not on file  Occupational History   Not on file  Tobacco Use   Smoking status: Every Day    Packs/day: .5    Types: Cigarettes   Smokeless tobacco: Never  Substance and Sexual Activity   Alcohol use: Yes    Alcohol/week: 0.0 standard drinks of alcohol    Comment: rarely   Drug use: Not on file   Sexual activity: Not on file  Other Topics Concern   Not on file  Social History Narrative   Married.   2 children.   Works doing Adult nurse.   Enjoys going to [employer], fishing, gardening.   Social Determinants of Health   Financial Resource Strain: Not on file  Food Insecurity: Not on file  Transportation Needs: Not on file  Physical Activity: Not on file  Stress: Not on file  Social Connections: Not on file  Intimate Partner Violence: Not on file    Past Surgical History:  Procedure Laterality Date   ABDOMINAL HYSTERECTOMY  2002   per pt was vaginal hysterectomy   CARPAL TUNNEL RELEASE  1996   left   REDUCTION MAMMAPLASTY Bilateral 20+yrs ago   cut under armpit are/ not full reduction  TUBAL LIGATION  1991    Family History  Problem Relation Age of Onset   Arthritis Father    Lung cancer Father    Hypertension Sister    Pancreatic cancer Mother    Diabetes Mother    Diabetes Maternal Aunt     Allergies  Allergen Reactions   Milk (Cow) Nausea And Vomiting   Penicillin G Rash    Current Outpatient Medications on File Prior to Visit  Medication Sig Dispense Refill   lisinopril (ZESTRIL) 10 MG tablet TAKE 1 TABLET BY MOUTH EVERY DAY FOR BLOOD PRESSURE 90 tablet 0   Multiple Vitamin (MULTIVITAMIN ADULT PO) Take by mouth daily.     Omega-3 Fatty Acids (FISH OIL PO) Take 500 mg by mouth daily.     rosuvastatin (CRESTOR) 5 MG tablet TAKE 1 TABLET (5 MG TOTAL) BY MOUTH DAILY.  FOR CHOLESTEROL 90 tablet 2   vitamin B-12 (CYANOCOBALAMIN) 250 MCG tablet Take 250 mcg by mouth daily.     vitamin E 400 UNIT capsule Take 400 Units by mouth daily.     zolpidem (AMBIEN) 10 MG tablet TAKE 1 TABLET (10 MG TOTAL) BY MOUTH AT BEDTIME. FOR SLEEP 90 tablet 0   azithromycin (ZITHROMAX) 250 MG tablet Take 2 tablets by mouth today, then 1 tablet daily for 4 additional days. (Patient not taking: Reported on 02/19/2023) 6 tablet 0   gabapentin (NEURONTIN) 100 MG capsule Take 1-2 capsules (100-200 mg total) by mouth 2 (two) times daily as needed (pain). (Patient not taking: Reported on 08/28/2022) 60 capsule 0   Current Facility-Administered Medications on File Prior to Visit  Medication Dose Route Frequency Provider Last Rate Last Admin   0.9 %  sodium chloride infusion  500 mL Intravenous Once Meryl DareStark, Malcolm T, MD        BP 124/66   Pulse 82   Temp 98.5 F (36.9 C) (Temporal)   Ht 5' (1.524 m)   Wt 142 lb (64.4 kg)   SpO2 100%   BMI 27.73 kg/m  Objective:   Physical Exam HENT:     Right Ear: Tympanic membrane and ear canal normal.     Left Ear: Tympanic membrane and ear canal normal.     Nose: Nose normal.  Eyes:     Conjunctiva/sclera: Conjunctivae normal.     Pupils: Pupils are equal, round, and reactive to light.  Neck:     Thyroid: No thyromegaly.  Cardiovascular:     Rate and Rhythm: Normal rate and regular rhythm.     Heart sounds: No murmur heard. Pulmonary:     Effort: Pulmonary effort is normal.     Breath sounds: Normal breath sounds. No rales.  Abdominal:     General: Bowel sounds are normal.     Palpations: Abdomen is soft.     Tenderness: There is no abdominal tenderness.  Musculoskeletal:        General: Normal range of motion.     Cervical back: Neck supple.  Lymphadenopathy:     Cervical: No cervical adenopathy.  Skin:    General: Skin is warm and dry.     Findings: No rash.  Neurological:     Mental Status: She is alert and oriented to  person, place, and time.     Cranial Nerves: No cranial nerve deficit.     Deep Tendon Reflexes: Reflexes are normal and symmetric.  Psychiatric:        Mood and Affect: Mood normal.  Assessment & Plan:  Preventative health care Assessment & Plan: Declines Shingrix vaccines.  Mammogram due in May and is scheduled. Colonoscopy UTD, due in July 2024  Discussed the importance of a healthy diet and regular exercise in order for weight loss, and to reduce the risk of further co-morbidity.  Exam stable. Labs pending.  Follow up in 1 year for repeat physical.    Essential hypertension Assessment & Plan: Controlled.  Continue lisinopril 10 mg daily. CMP pending.  Orders: -     Comprehensive metabolic panel  Type 2 diabetes mellitus with hyperglycemia, without long-term current use of insulin Assessment & Plan: Repeat A1C pending.  Remain off treatment. Commended her on dietary changes.   Orders: -     Hemoglobin A1c  Arthritis Assessment & Plan: Infrequent use of gabapentin. Continue gabapentin 100-200 mg PRN for which she uses sparingly.    Chronic midline low back pain without sciatica Assessment & Plan: Waxes and wanes.  Continue gabapentin 100-200 mg PRN.   GAD (generalized anxiety disorder) Assessment & Plan: Controlled.   Continue to monitor.    Glaucoma, unspecified glaucoma type, unspecified laterality Assessment & Plan: Following with ophthalmology. She will schedule an appointment.   Hyperlipidemia, unspecified hyperlipidemia type Assessment & Plan: Repeat lipid panel pending. Continue rosuvastatin 5 mg daily.  Orders: -     Lipid panel  Insomnia, unspecified type Assessment & Plan: Overall controlled.  Continue Ambien 10 mg HS for which she has been taking for years.   Tobacco abuse Assessment & Plan: Does not qualify for lung cancer screening.         Doreene Nest, NP

## 2023-02-19 NOTE — Assessment & Plan Note (Signed)
Controlled. ? ?Continue lisinopril 10 mg daily. ?CMP pending. ?

## 2023-02-19 NOTE — Assessment & Plan Note (Signed)
Repeat A1C pending.  Remain off treatment. Commended her on dietary changes.

## 2023-02-19 NOTE — Assessment & Plan Note (Signed)
Declines Shingrix vaccines.  Mammogram due in May and is scheduled. Colonoscopy UTD, due in July 2024  Discussed the importance of a healthy diet and regular exercise in order for weight loss, and to reduce the risk of further co-morbidity.  Exam stable. Labs pending.  Follow up in 1 year for repeat physical.

## 2023-02-19 NOTE — Assessment & Plan Note (Signed)
Repeat lipid panel pending. Continue rosuvastatin 5 mg daily. 

## 2023-02-19 NOTE — Assessment & Plan Note (Signed)
Does not qualify for lung cancer screening.

## 2023-02-19 NOTE — Assessment & Plan Note (Signed)
Controlled.  Continue to monitor.  

## 2023-02-19 NOTE — Assessment & Plan Note (Signed)
Infrequent use of gabapentin. Continue gabapentin 100-200 mg PRN for which she uses sparingly.

## 2023-03-12 ENCOUNTER — Ambulatory Visit
Admission: RE | Admit: 2023-03-12 | Discharge: 2023-03-12 | Disposition: A | Discharge status: Home / Self Care | Payer: BC Managed Care – PPO | Source: Ambulatory Visit | Attending: Primary Care | Admitting: Primary Care

## 2023-03-12 DIAGNOSIS — Z1231 Encounter for screening mammogram for malignant neoplasm of breast: Secondary | ICD-10-CM

## 2023-04-08 ENCOUNTER — Encounter: Payer: Self-pay | Admitting: Gastroenterology

## 2023-04-08 DIAGNOSIS — H40013 Open angle with borderline findings, low risk, bilateral: Secondary | ICD-10-CM | POA: Diagnosis not present

## 2023-04-08 LAB — HM DIABETES EYE EXAM

## 2023-04-28 ENCOUNTER — Encounter: Payer: Self-pay | Admitting: Nurse Practitioner

## 2023-04-28 ENCOUNTER — Ambulatory Visit: Payer: BC Managed Care – PPO | Admitting: Nurse Practitioner

## 2023-04-28 VITALS — BP 135/83 | HR 67 | Temp 98.0°F | Ht 61.02 in | Wt 141.8 lb

## 2023-04-28 DIAGNOSIS — G4700 Insomnia, unspecified: Secondary | ICD-10-CM

## 2023-04-28 DIAGNOSIS — E1165 Type 2 diabetes mellitus with hyperglycemia: Secondary | ICD-10-CM

## 2023-04-28 DIAGNOSIS — I10 Essential (primary) hypertension: Secondary | ICD-10-CM

## 2023-04-28 DIAGNOSIS — E559 Vitamin D deficiency, unspecified: Secondary | ICD-10-CM | POA: Diagnosis not present

## 2023-04-28 DIAGNOSIS — Z7689 Persons encountering health services in other specified circumstances: Secondary | ICD-10-CM

## 2023-04-28 DIAGNOSIS — E785 Hyperlipidemia, unspecified: Secondary | ICD-10-CM | POA: Diagnosis not present

## 2023-04-28 MED ORDER — ZOLPIDEM TARTRATE 10 MG PO TABS
10.0000 mg | ORAL_TABLET | Freq: Every day | ORAL | 5 refills | Status: DC
Start: 2023-04-28 — End: 2023-10-29

## 2023-04-28 MED ORDER — LISINOPRIL 10 MG PO TABS
ORAL_TABLET | ORAL | 1 refills | Status: DC
Start: 2023-04-28 — End: 2023-10-27

## 2023-04-28 NOTE — Assessment & Plan Note (Signed)
Labs ordered at visit today.  Will make recommendations based on lab results.   

## 2023-04-28 NOTE — Progress Notes (Signed)
BP 135/83   Pulse 67   Temp 98 F (36.7 C) (Oral)   Ht 5' 1.02" (1.55 m)   Wt 141 lb 12.8 oz (64.3 kg)   SpO2 100%   BMI 26.77 kg/m    Subjective:    Patient ID: Monica Daniel, female    DOB: 08/03/1966, 57 y.o.   MRN: [id number]  HPI: Monica Daniel is a 57 y.o. female  No chief complaint on file.  Patient presents to clinic to establish care with new PCP.  Introduced to Publishing rights manager role and practice setting.  All questions answered.  Discussed provider/patient relationship and expectations.  Patient reports a history of Type 2 diabetes, hypertension, high cholesterol, anxiety- well controlled without medication, insomnia.   Patient denies a history of: Thyroid problems, Depression, Neurological problems, and Abdominal problems.   HYPERTENSION / HYPERLIPIDEMIA Satisfied with current treatment? yes Duration of hypertension: years BP monitoring frequency: rarely BP range: 120/80 BP medication side effects: no Past BP meds: Lisinopril Duration of hyperlipidemia: years Cholesterol medication side effects: no Cholesterol supplements: none Past cholesterol medications: rosuvastatin (crestor)- did not tolerate medication well. Medication compliance: excellent compliance Aspirin: no Recent stressors: no Recurrent headaches: no Visual changes: no Palpitations: no Dyspnea: no Chest pain: no Lower extremity edema: no Dizzy/lightheaded: no  DIABETES Hypoglycemic episodes:no Polydipsia/polyuria: no Visual disturbance: no Chest pain: no Paresthesias: no Glucose Monitoring: no  Accucheck frequency: Not Checking  Fasting glucose:  Post prandial:  Evening:  Before meals: Taking Insulin?: no  Long acting insulin:  Short acting insulin: Blood Pressure Monitoring: rarely Retinal Examination: Up to Date Foot Exam: Not up to Date Diabetic Education: Not Completed Pneumovax: Up to Date Influenza: Not up to Date Aspirin: no  [facility]  INSOMNIA Patient states she uses the Ambien nightly.  She states it works well for her.  Denies concerns regarding medication today.   Active Ambulatory Problems    Diagnosis Date Noted   Arthritis 08/23/2015   Glaucoma 08/23/2015   Essential hypertension 08/23/2015   Preventative health care 12/13/2015   Type 2 diabetes mellitus with hyperglycemia ([facility]) 12/13/2015   Vitamin D deficiency 12/13/2015   Insomnia 10/28/2016   Tobacco abuse 01/27/2017   Chronic midline low back pain without sciatica 12/03/2017   Torus palatinus 12/29/2018   GAD (generalized anxiety disorder) 04/25/2021   Hyperlipidemia 10/17/2021   Chronic neck pain 01/16/2022   Resolved Ambulatory Problems    Diagnosis Date Noted   Viral URI 12/22/2016   Sinus pressure 08/28/2022   Past Medical History:  Diagnosis Date   Allergy    Cancer ([facility]) 2011   Chickenpox    Seizures ([facility])    Past Surgical History:  Procedure Laterality Date   ABDOMINAL HYSTERECTOMY  2002   per pt was vaginal hysterectomy   CARPAL TUNNEL RELEASE  1996   left   REDUCTION MAMMAPLASTY Bilateral 20+yrs ago   cut under armpit are/ not full reduction   TUBAL LIGATION  1991   Family History  Problem Relation Age of Onset   Arthritis Father    Lung cancer Father    Hypertension Sister    Pancreatic cancer Mother    Diabetes Mother    Diabetes Maternal Aunt      Review of Systems  Eyes:  Negative for visual disturbance.  Respiratory:  Negative for cough, chest tightness and shortness of breath.   Cardiovascular:  Negative for chest pain, palpitations and leg swelling.  Endocrine: Negative for polydipsia and  polyuria.  Neurological:  Negative for dizziness, numbness and headaches.  Psychiatric/Behavioral:  Positive for sleep disturbance.     Per HPI unless specifically indicated above     Objective:    BP 135/83   Pulse 67   Temp 98 F (36.7 C) (Oral)   Ht 5' 1.02" (1.55 m)   Wt 141 lb 12.8 oz (64.3 kg)    SpO2 100%   BMI 26.77 kg/m   Wt Readings from Last 3 Encounters:  04/28/23 141 lb 12.8 oz (64.3 kg)  02/19/23 142 lb (64.4 kg)  08/28/22 140 lb (63.5 kg)    Physical Exam Vitals and nursing note reviewed.  Constitutional:      General: She is not in acute distress.    Appearance: Normal appearance. She is not ill-appearing, toxic-appearing or diaphoretic.  HENT:     Head: Normocephalic.     Right Ear: External ear normal.     Left Ear: External ear normal.     Nose: Nose normal.     Mouth/Throat:     Mouth: Mucous membranes are moist.     Pharynx: Oropharynx is clear.  Eyes:     General:        Right eye: No discharge.        Left eye: No discharge.     Extraocular Movements: Extraocular movements intact.     Conjunctiva/sclera: Conjunctivae normal.     Pupils: Pupils are equal, round, and reactive to light.  Cardiovascular:     Rate and Rhythm: Normal rate and regular rhythm.     Heart sounds: No murmur heard. Pulmonary:     Effort: Pulmonary effort is normal. No respiratory distress.     Breath sounds: Normal breath sounds. No wheezing or rales.  Musculoskeletal:     Cervical back: Normal range of motion and neck supple.  Skin:    General: Skin is warm and dry.     Capillary Refill: Capillary refill takes less than 2 seconds.  Neurological:     General: No focal deficit present.     Mental Status: She is alert and oriented to person, place, and time. Mental status is at baseline.  Psychiatric:        Mood and Affect: Mood normal.        Behavior: Behavior normal.        Thought Content: Thought content normal.        Judgment: Judgment normal.     Results for orders placed or performed in visit on 02/19/23  Lipid panel  Result Value Ref Range   Cholesterol 135 0 - 200 mg/dL   Triglycerides 161.0 (H) 0.0 - 149.0 mg/dL   HDL 96.04 >54.09 mg/dL   VLDL 81.1 (H) 0.0 - 91.4 mg/dL   Total CHOL/HDL Ratio 3    NonHDL 96.14   Hemoglobin A1c  Result Value Ref  Range   Hgb A1c MFr Bld 6.4 4.6 - 6.5 %  Comprehensive metabolic panel  Result Value Ref Range   Sodium 140 135 - 145 mEq/L   Potassium 3.6 3.5 - 5.1 mEq/L   Chloride 106 96 - 112 mEq/L   CO2 27 19 - 32 mEq/L   Glucose, Bld 161 (H) 70 - 99 mg/dL   BUN 22 6 - 23 mg/dL   Creatinine, Ser 7.82 0.40 - 1.20 mg/dL   Total Bilirubin 0.2 0.2 - 1.2 mg/dL   Alkaline Phosphatase 87 39 - 117 U/L   AST 15 0 - 37 U/L  ALT 17 0 - 35 U/L   Total Protein 6.9 6.0 - 8.3 g/dL   Albumin 4.6 3.5 - 5.2 g/dL   GFR 16.10 >96.04 mL/min   Calcium 9.4 8.4 - 10.5 mg/dL  LDL cholesterol, direct  Result Value Ref Range   Direct LDL 67.0 mg/dL      Assessment & Plan:   Problem List Items Addressed This Visit       Cardiovascular and Mediastinum   Essential hypertension    Chronic.  Controlled.  Continue with current medication regimen of Lisinopril 10mg .  Refills sent today.  Labs ordered today.  Return to clinic in 6 months for reevaluation.  Call sooner if concerns arise.        Relevant Medications   lisinopril (ZESTRIL) 10 MG tablet   Other Relevant Orders   Comp Met (CMET)     Endocrine   Type 2 diabetes mellitus with hyperglycemia ([facility]) - Primary    Chronic.  Controlled without medication.  States she has been on medication before and it caused low blood sugar. Will check labs at visit today.  Will request eye exam.   Return to clinic in 6 months for reevaluation.  Call sooner if concerns arise.        Relevant Medications   lisinopril (ZESTRIL) 10 MG tablet   Other Relevant Orders   HgB A1c     Other   Vitamin D deficiency    Labs ordered at visit today.  Will make recommendations based on lab results.          Relevant Orders   Vitamin D (25 hydroxy)   Insomnia    Chronic.  Controlled.  Continue with current medication regimen of Ambien nightly.  Aware of risks of taking medication and okay with continuing.  Refills sent today.  Labs ordered today.  Return to clinic in 6 months  for reevaluation.  Call sooner if concerns arise.        Relevant Medications   zolpidem (AMBIEN) 10 MG tablet   Hyperlipidemia   Relevant Medications   lisinopril (ZESTRIL) 10 MG tablet   Other Visit Diagnoses     Encounter to establish care            Follow up plan: Return in about 6 months (around 10/28/2023) for HTN, HLD, DM2 FU.

## 2023-04-28 NOTE — Assessment & Plan Note (Signed)
Chronic.  Controlled.  Continue with current medication regimen of Lisinopril 10mg.  Refills sent today. Labs ordered today.  Return to clinic in 6 months for reevaluation.  Call sooner if concerns arise.   

## 2023-04-28 NOTE — Assessment & Plan Note (Signed)
Chronic.  Controlled without medication.  States she has been on medication before and it caused low blood sugar. Will check labs at visit today.  Will request eye exam.   Return to clinic in 6 months for reevaluation.  Call sooner if concerns arise.

## 2023-04-28 NOTE — Assessment & Plan Note (Signed)
Chronic.  Controlled.  Continue with current medication regimen of Ambien nightly.  Aware of risks of taking medication and okay with continuing.  Refills sent today.  Labs ordered today.  Return to clinic in 6 months for reevaluation.  Call sooner if concerns arise.

## 2023-04-29 ENCOUNTER — Encounter: Payer: Self-pay | Admitting: Nurse Practitioner

## 2023-04-29 LAB — COMPREHENSIVE METABOLIC PANEL
ALT: 26 IU/L (ref 0–32)
AST: 18 IU/L (ref 0–40)
Albumin: 4.8 g/dL (ref 3.8–4.9)
Alkaline Phosphatase: 110 IU/L (ref 44–121)
BUN/Creatinine Ratio: 30 — ABNORMAL HIGH (ref 9–23)
BUN: 20 mg/dL (ref 6–24)
Bilirubin Total: <0.2 mg/dL (ref 0.0–1.2)
CO2: 21 mmol/L (ref 20–29)
Calcium: 9.7 mg/dL (ref 8.7–10.2)
Chloride: 106 mmol/L (ref 96–106)
Creatinine, Ser: 0.67 mg/dL (ref 0.57–1.00)
Globulin, Total: 2.3 g/dL (ref 1.5–4.5)
Glucose: 98 mg/dL (ref 70–99)
Potassium: 4.3 mmol/L (ref 3.5–5.2)
Sodium: 141 mmol/L (ref 134–144)
Total Protein: 7.1 g/dL (ref 6.0–8.5)
eGFR: 103 mL/min/{1.73_m2} (ref >59)

## 2023-04-29 LAB — HEMOGLOBIN A1C
Est. average glucose Bld gHb Est-mCnc: 137 mg/dL
Hgb A1c MFr Bld: 6.4 % — ABNORMAL HIGH (ref 4.8–5.6)

## 2023-04-29 LAB — VITAMIN D 25 HYDROXY (VIT D DEFICIENCY, FRACTURES): Vit D, 25-Hydroxy: 47.7 ng/mL (ref 30.0–100.0)

## 2023-04-29 NOTE — Progress Notes (Signed)
HI Monica Daniel. It was nice to meet you yesterday.  Your lab work looks good.  Your A1c remains well controlled at 6.4%. No concerns at this time. Continue with your current medication regimen.  Follow up as discussed.  Please let me know if you have any questions.

## 2023-10-26 ENCOUNTER — Other Ambulatory Visit: Payer: Self-pay | Admitting: Nurse Practitioner

## 2023-10-26 DIAGNOSIS — G4700 Insomnia, unspecified: Secondary | ICD-10-CM

## 2023-10-26 DIAGNOSIS — I10 Essential (primary) hypertension: Secondary | ICD-10-CM

## 2023-10-27 NOTE — Telephone Encounter (Signed)
Requested medications are due for refill today.  yes  Requested medications are on the active medications list.  yes  Last refill. 04/28/2023 #30 5 rf  Future visit scheduled.   yes  Notes to clinic.  Refill not delegated.    Requested Prescriptions  Pending Prescriptions Disp Refills   zolpidem (AMBIEN) 10 MG tablet [Pharmacy Med Name: ZOLPIDEM TARTRATE 10 MG TABLET] 30 tablet     Sig: TAKE 1 TABLET (10 MG TOTAL) BY MOUTH AT BEDTIME. FOR SLEEP     Not Delegated - Psychiatry:  Anxiolytics/Hypnotics Failed - 10/27/2023  1:53 PM      Failed - This refill cannot be delegated      Failed - Urine Drug Screen completed in last 360 days      Failed - Valid encounter within last 6 months    Recent Outpatient Visits           6 months ago Type 2 diabetes mellitus with hyperglycemia, without long-term current use of insulin (HCC)   [place] [facility] Larae Grooms, NP       Future Appointments             In 2 days Larae Grooms, NP [place] [facility]            Signed Prescriptions Disp Refills   lisinopril (ZESTRIL) 10 MG tablet 90 tablet 0    Sig: TAKE 1 TABLET BY MOUTH EVERY DAY FOR BLOOD PRESSURE     Cardiovascular:  ACE Inhibitors Failed - 10/27/2023  1:53 PM      Failed - Cr in normal range and within 180 days    Creatinine, Ser  Date Value Ref Range Status  04/28/2023 0.67 0.57 - 1.00 mg/dL Final   Creatinine,U  Date Value Ref Range Status  08/28/2022 25.3 mg/dL Final         Failed - K in normal range and within 180 days    Potassium  Date Value Ref Range Status  04/28/2023 4.3 3.5 - 5.2 mmol/L Final         Failed - Valid encounter within last 6 months    Recent Outpatient Visits           6 months ago Type 2 diabetes mellitus with hyperglycemia, without long-term current use of insulin [facility])   [place] [facility] Larae Grooms, NP       Future Appointments             In 2  days Larae Grooms, NP [place] [facility], [facility]            Passed - Patient is not pregnant      Passed - Last BP in normal range    BP Readings from Last 1 Encounters:  04/28/23 135/83

## 2023-10-27 NOTE — Telephone Encounter (Signed)
Requested Prescriptions  Pending Prescriptions Disp Refills   zolpidem (AMBIEN) 10 MG tablet [Pharmacy Med Name: ZOLPIDEM TARTRATE 10 MG TABLET] 30 tablet     Sig: TAKE 1 TABLET (10 MG TOTAL) BY MOUTH AT BEDTIME. FOR SLEEP     Not Delegated - Psychiatry:  Anxiolytics/Hypnotics Failed - 10/27/2023  1:52 PM      Failed - This refill cannot be delegated      Failed - Urine Drug Screen completed in last 360 days      Failed - Valid encounter within last 6 months    Recent Outpatient Visits           6 months ago Type 2 diabetes mellitus with hyperglycemia, without long-term current use of insulin (HCC)   [place] [facility] Larae Grooms, NP       Future Appointments             In 2 days Larae Grooms, NP [place] [facility]             lisinopril (ZESTRIL) 10 MG tablet [Pharmacy Med Name: LISINOPRIL 10 MG TABLET] 90 tablet 0    Sig: TAKE 1 TABLET BY MOUTH EVERY DAY FOR BLOOD PRESSURE     Cardiovascular:  ACE Inhibitors Failed - 10/27/2023  1:52 PM      Failed - Cr in normal range and within 180 days    Creatinine, Ser  Date Value Ref Range Status  04/28/2023 0.67 0.57 - 1.00 mg/dL Final   Creatinine,U  Date Value Ref Range Status  08/28/2022 25.3 mg/dL Final         Failed - K in normal range and within 180 days    Potassium  Date Value Ref Range Status  04/28/2023 4.3 3.5 - 5.2 mmol/L Final         Failed - Valid encounter within last 6 months    Recent Outpatient Visits           6 months ago Type 2 diabetes mellitus with hyperglycemia, without long-term current use of insulin [facility])   [place] [facility] Larae Grooms, NP       Future Appointments             In 2 days Larae Grooms, NP  [facility], [facility]            Passed - Patient is not pregnant      Passed - Last BP in normal range    BP Readings from Last 1 Encounters:  04/28/23 135/83

## 2023-10-29 ENCOUNTER — Encounter: Payer: Self-pay | Admitting: Nurse Practitioner

## 2023-10-29 ENCOUNTER — Ambulatory Visit: Payer: BC Managed Care – PPO | Admitting: Nurse Practitioner

## 2023-10-29 VITALS — BP 132/75 | HR 83 | Temp 97.3°F | Ht 60.63 in | Wt 142.2 lb

## 2023-10-29 DIAGNOSIS — E559 Vitamin D deficiency, unspecified: Secondary | ICD-10-CM | POA: Diagnosis not present

## 2023-10-29 DIAGNOSIS — I10 Essential (primary) hypertension: Secondary | ICD-10-CM

## 2023-10-29 DIAGNOSIS — Z789 Other specified health status: Secondary | ICD-10-CM

## 2023-10-29 DIAGNOSIS — Z1159 Encounter for screening for other viral diseases: Secondary | ICD-10-CM

## 2023-10-29 DIAGNOSIS — Z114 Encounter for screening for human immunodeficiency virus [HIV]: Secondary | ICD-10-CM

## 2023-10-29 DIAGNOSIS — E1165 Type 2 diabetes mellitus with hyperglycemia: Secondary | ICD-10-CM | POA: Diagnosis not present

## 2023-10-29 DIAGNOSIS — E785 Hyperlipidemia, unspecified: Secondary | ICD-10-CM | POA: Diagnosis not present

## 2023-10-29 DIAGNOSIS — G4700 Insomnia, unspecified: Secondary | ICD-10-CM

## 2023-10-29 LAB — MICROALBUMIN, URINE WAIVED
Creatinine, Urine Waived: 50 mg/dL (ref 10–300)
Microalb, Ur Waived: 10 mg/L (ref 0–19)

## 2023-10-29 MED ORDER — LISINOPRIL 10 MG PO TABS: ORAL_TABLET | ORAL | 1 refills | Status: DC

## 2023-10-29 MED ORDER — ZOLPIDEM TARTRATE 10 MG PO TABS: 10.0000 mg | ORAL_TABLET | Freq: Every day | ORAL | 5 refills | Status: DC

## 2023-10-29 NOTE — Assessment & Plan Note (Signed)
Chronic.  Does not tolerated statins daily.  Discussed taking medication once weekly to help cut down on side effects.  Labs ordered today.  Follow up in 6 months.  Call sooner if concerns arise.

## 2023-10-29 NOTE — Progress Notes (Signed)
BP 132/75 (BP Location: Left Arm, Patient Position: Sitting, Cuff Size: Normal)   Pulse 83   Temp (!) 97.3 F (36.3 C) (Oral)   Ht 5' 0.63" (1.54 m)   Wt 142 lb 3.2 oz (64.5 kg)   SpO2 98%   BMI 27.20 kg/m    Subjective:    Patient ID: Monica Daniel, female    DOB: Sep 06, 1966, 56 y.o.   MRN: [id number]  HPI: Monica Daniel is a 57 y.o. female  Chief Complaint  Patient presents with   6 month follow up    No concerns today, would like to try an alternative to crestor    Annual Exam   Diabetes   Hypertension   HYPERTENSION / HYPERLIPIDEMIA Satisfied with current treatment? yes Duration of hypertension: years BP monitoring frequency: not checking BP range:  BP medication side effects: no Past BP meds: Lisinopril Duration of hyperlipidemia: years Cholesterol medication side effects: no Cholesterol supplements: none Past cholesterol medications: rosuvastatin (crestor)- did not tolerate it well. Medication compliance: excellent compliance Aspirin: no Recent stressors: no Recurrent headaches: no Visual changes: no Palpitations: no Dyspnea: no Chest pain: no Lower extremity edema: no Dizzy/lightheaded: no   DIABETES Well controlled without medication. Has not been on medication for about 2 years. Hypoglycemic episodes:no Polydipsia/polyuria: no Visual disturbance: no Chest pain: no Paresthesias: no Glucose Monitoring: no             Accucheck frequency: Not Checking             Fasting glucose:             Post prandial:             Evening:             Before meals: Taking Insulin?: no             Long acting insulin:             Short acting insulin: Blood Pressure Monitoring: rarely Retinal Examination: Up to Date Foot Exam: Not up to Date Diabetic Education: Not Completed Pneumovax: Up to Date Influenza: Not up to Date Aspirin: no   [facility]   INSOMNIA Patient states she uses the Ambien nightly.  She states it works well for  her.  Denies concerns regarding medication today.  She doesn't use it every single night.   Relevant past medical, surgical, family and social history reviewed and updated as indicated. Interim medical history since our last visit reviewed. Allergies and medications reviewed and updated.  Review of Systems  Eyes:  Negative for visual disturbance.  Respiratory:  Negative for cough, chest tightness and shortness of breath.   Cardiovascular:  Negative for chest pain, palpitations and leg swelling.  Endocrine: Negative for polydipsia and polyuria.  Neurological:  Negative for dizziness, numbness and headaches.    Per HPI unless specifically indicated above     Objective:    BP 132/75 (BP Location: Left Arm, Patient Position: Sitting, Cuff Size: Normal)   Pulse 83   Temp (!) 97.3 F (36.3 C) (Oral)   Ht 5' 0.63" (1.54 m)   Wt 142 lb 3.2 oz (64.5 kg)   SpO2 98%   BMI 27.20 kg/m   Wt Readings from Last 3 Encounters:  10/29/23 142 lb 3.2 oz (64.5 kg)  04/28/23 141 lb 12.8 oz (64.3 kg)  02/19/23 142 lb (64.4 kg)    Physical Exam Vitals and nursing note reviewed.  Constitutional:  General: She is not in acute distress.    Appearance: Normal appearance. She is normal weight. She is not ill-appearing, toxic-appearing or diaphoretic.  HENT:     Head: Normocephalic.     Right Ear: External ear normal.     Left Ear: External ear normal.     Nose: Nose normal.     Mouth/Throat:     Mouth: Mucous membranes are moist.     Pharynx: Oropharynx is clear.  Eyes:     General:        Right eye: No discharge.        Left eye: No discharge.     Extraocular Movements: Extraocular movements intact.     Conjunctiva/sclera: Conjunctivae normal.     Pupils: Pupils are equal, round, and reactive to light.  Cardiovascular:     Rate and Rhythm: Normal rate and regular rhythm.     Heart sounds: No murmur heard. Pulmonary:     Effort: Pulmonary effort is normal. No respiratory distress.      Breath sounds: Normal breath sounds. No wheezing or rales.  Musculoskeletal:     Cervical back: Normal range of motion and neck supple.  Skin:    General: Skin is warm and dry.     Capillary Refill: Capillary refill takes less than 2 seconds.  Neurological:     General: No focal deficit present.     Mental Status: She is alert and oriented to person, place, and time. Mental status is at baseline.  Psychiatric:        Mood and Affect: Mood normal.        Behavior: Behavior normal.        Thought Content: Thought content normal.        Judgment: Judgment normal.     Results for orders placed or performed in visit on 04/29/23  HM DIABETES EYE EXAM   Collection Time: 04/08/23 12:00 AM  Result Value Ref Range   HM Diabetic Eye Exam No Retinopathy No Retinopathy      Assessment & Plan:   Problem List Items Addressed This Visit       Cardiovascular and Mediastinum   Essential hypertension   Chronic.  Controlled.  Continue with current medication regimen of Lisinopril 10mg .  Refills sent today.  Labs ordered today.  Return to clinic in 6 months for reevaluation.  Call sooner if concerns arise.       Relevant Medications   lisinopril (ZESTRIL) 10 MG tablet     Endocrine   Type 2 diabetes mellitus with hyperglycemia (HCC) - Primary   Chronic.  Controlled.  Last A1c was 6.4%.  Not currently on medication.  Up to date on eye exam.  Labs ordered today.  Return to clinic in 6 months for reevaluation.  Call sooner if concerns arise.        Relevant Medications   lisinopril (ZESTRIL) 10 MG tablet   Other Relevant Orders   Comprehensive metabolic panel   HgB A1c   Microalbumin, Urine Waived     Other   Vitamin D deficiency   Labs ordered at visit today.  Will make recommendations based on lab results.        Relevant Orders   Vitamin D (25 hydroxy)   Insomnia   Relevant Medications   zolpidem (AMBIEN) 10 MG tablet   Hyperlipidemia   Chronic.  Does not tolerated statins  daily.  Discussed taking medication once weekly to help cut down on side effects.  Labs  ordered today.  Follow up in 6 months.  Call sooner if concerns arise.       Relevant Medications   lisinopril (ZESTRIL) 10 MG tablet   Other Relevant Orders   Lipid panel   Statin intolerance   Chronic.  Does not tolerated Rosuvastatin daily.      Other Visit Diagnoses       Screening for HIV (human immunodeficiency virus)       Relevant Orders   HIV Antibody (routine testing w rflx)     Encounter for hepatitis C screening test for low risk patient       Relevant Orders   Hepatitis C Antibody        Follow up plan: Return in about 6 months (around 04/28/2024) for Physical and Fasting labs.

## 2023-10-29 NOTE — Progress Notes (Deleted)
There were no vitals taken for this visit.   Subjective:    Patient ID: Monica Daniel, female    DOB: 1966/06/04, 57 y.o.   MRN: [id number]  HPI: Monica Daniel is a 57 y.o. female presenting on 10/29/2023 for comprehensive medical examination. Current medical complaints include:{Blank single:19197::"none","***"}  She currently lives with: Menopausal Symptoms: {Blank single:19197::"yes","no"}  HYPERTENSION / HYPERLIPIDEMIA Satisfied with current treatment? yes Duration of hypertension: years BP monitoring frequency: rarely BP range: 120/80 BP medication side effects: no Past BP meds: Lisinopril Duration of hyperlipidemia: years Cholesterol medication side effects: no Cholesterol supplements: none Past cholesterol medications: rosuvastatin (crestor)- did not tolerate medication well. Medication compliance: excellent compliance Aspirin: no Recent stressors: no Recurrent headaches: no Visual changes: no Palpitations: no Dyspnea: no Chest pain: no Lower extremity edema: no Dizzy/lightheaded: no   DIABETES Hypoglycemic episodes:no Polydipsia/polyuria: no Visual disturbance: no Chest pain: no Paresthesias: no Glucose Monitoring: no             Accucheck frequency: Not Checking             Fasting glucose:             Post prandial:             Evening:             Before meals: Taking Insulin?: no             Long acting insulin:             Short acting insulin: Blood Pressure Monitoring: rarely Retinal Examination: Up to Date Foot Exam: Not up to Date Diabetic Education: Not Completed Pneumovax: Up to Date Influenza: Not up to Date Aspirin: no   [facility]   INSOMNIA Patient states she uses the Ambien nightly.  She states it works well for her.  Denies concerns regarding medication today.  Depression Screen done today and results listed below:     04/28/2023    9:49 AM 02/19/2023   11:11 AM 10/17/2021   11:38 AM 09/27/2020   11:04 AM  01/29/2018    1:59 PM  Depression screen PHQ 2/9  Decreased Interest 0 0 0 0 0  Down, Depressed, Hopeless 0 0 0 0 0  PHQ - 2 Score 0 0 0 0 0  Altered sleeping 3      Tired, decreased energy 0      Change in appetite 0      Feeling bad or failure about yourself  0      Trouble concentrating 0      Moving slowly or fidgety/restless 0      Suicidal thoughts 0      PHQ-9 Score 3      Difficult doing work/chores Not difficult at all        The patient {has/does not JYNW:29562} a history of falls. I {did/did not:19850} complete a risk assessment for falls. A plan of care for falls {was/was not:19852} documented.   Past Medical History:  Past Medical History:  Diagnosis Date   Allergy    Arthritis    Cancer (HCC) 2011   basal cell on lip   Chickenpox    Glaucoma    no meds   Hyperlipidemia    Seizures (HCC)    as a child/ last one 74-49 years old   Torus palatinus     Surgical History:  Past Surgical History:  Procedure Laterality Date   ABDOMINAL HYSTERECTOMY  2002   per  pt was vaginal hysterectomy   CARPAL TUNNEL RELEASE  1996   left   REDUCTION MAMMAPLASTY Bilateral 20+yrs ago   cut under armpit are/ not full reduction   TUBAL LIGATION  1991    Medications:  Current Outpatient Medications on File Prior to Visit  Medication Sig   lisinopril (ZESTRIL) 10 MG tablet TAKE 1 TABLET BY MOUTH EVERY DAY FOR BLOOD PRESSURE   Multiple Vitamin (MULTIVITAMIN ADULT PO) Take by mouth daily.   Omega-3 Fatty Acids (FISH OIL PO) Take 500 mg by mouth daily.   rosuvastatin (CRESTOR) 5 MG tablet TAKE 1 TABLET (5 MG TOTAL) BY MOUTH DAILY. FOR CHOLESTEROL (Patient not taking: Reported on 04/28/2023)   vitamin B-12 (CYANOCOBALAMIN) 250 MCG tablet Take 250 mcg by mouth daily.   vitamin E 400 UNIT capsule Take 400 Units by mouth daily.   zolpidem (AMBIEN) 10 MG tablet Take 1 tablet (10 mg total) by mouth at bedtime. For sleep   Current Facility-Administered Medications on File Prior to Visit   Medication   0.9 %  sodium chloride infusion    Allergies:  Allergies  Allergen Reactions   Milk (Cow) Nausea And Vomiting   Penicillin G Rash    Social History:  Social History   Socioeconomic History   Marital status: Married    Spouse name: Not on file   Number of children: Not on file   Years of education: Not on file   Highest education level: Not on file  Occupational History   Not on file  Tobacco Use   Smoking status: Every Day    Current packs/day: 0.50    Types: Cigarettes   Smokeless tobacco: Never  Substance and Sexual Activity   Alcohol use: Yes    Alcohol/week: 0.0 standard drinks of alcohol    Comment: rarely   Drug use: Not on file   Sexual activity: Not on file  Other Topics Concern   Not on file  Social History Narrative   Married.   2 children.   Works doing Adult nurse.   Enjoys going to [employer], fishing, gardening.   Social Drivers of Corporate investment banker Strain: Not on file  Food Insecurity: Not on file  Transportation Needs: Not on file  Physical Activity: Not on file  Stress: Not on file  Social Connections: Not on file  Intimate Partner Violence: Not on file   Social History   Tobacco Use  Smoking Status Every Day   Current packs/day: 0.50   Types: Cigarettes  Smokeless Tobacco Never   Social History   Substance and Sexual Activity  Alcohol Use Yes   Alcohol/week: 0.0 standard drinks of alcohol   Comment: rarely    Family History:  Family History  Problem Relation Age of Onset   Arthritis Father    Lung cancer Father    Hypertension Sister    Pancreatic cancer Mother    Diabetes Mother    Diabetes Maternal Aunt     Past medical history, surgical history, medications, allergies, family history and social history reviewed with patient today and changes made to appropriate areas of the chart.   ROS All other ROS negative except what is listed above and in the HPI.      Objective:    There  were no vitals taken for this visit.  Wt Readings from Last 3 Encounters:  04/28/23 141 lb 12.8 oz (64.3 kg)  02/19/23 142 lb (64.4 kg)  08/28/22 140 lb (63.5 kg)  Physical Exam  Results for orders placed or performed in visit on 04/29/23  HM DIABETES EYE EXAM   Collection Time: 04/08/23 12:00 AM  Result Value Ref Range   HM Diabetic Eye Exam No Retinopathy No Retinopathy      Assessment & Plan:   Problem List Items Addressed This Visit   None    Follow up plan: No follow-ups on file.   LABORATORY TESTING:  - Pap smear: {Blank single:19197::"pap done","not applicable","up to date","done elsewhere"}  IMMUNIZATIONS:   - Tdap: Tetanus vaccination status reviewed: {tetanus status:315746}. - Influenza: {Blank single:19197::"Up to date","Administered today","Postponed to flu season","Refused","Given elsewhere"} - Pneumovax: {Blank single:19197::"Up to date","Administered today","Not applicable","Refused","Given elsewhere"} - Prevnar: {Blank single:19197::"Up to date","Administered today","Not applicable","Refused","Given elsewhere"} - COVID: {Blank single:19197::"Up to date","Administered today","Not applicable","Refused","Given elsewhere"} - HPV: {Blank single:19197::"Up to date","Administered today","Not applicable","Refused","Given elsewhere"} - Shingrix vaccine: {Blank single:19197::"Up to date","Administered today","Not applicable","Refused","Given elsewhere"}  SCREENING: -Mammogram: {Blank single:19197::"Up to date","Ordered today","Not applicable","Refused","Done elsewhere"}  - Colonoscopy: {Blank single:19197::"Up to date","Ordered today","Not applicable","Refused","Done elsewhere"}  - Bone Density: {Blank single:19197::"Up to date","Ordered today","Not applicable","Refused","Done elsewhere"}  -Hearing Test: {Blank single:19197::"Up to date","Ordered today","Not applicable","Refused","Done elsewhere"}  -Spirometry: {Blank single:19197::"Up to date","Ordered today","Not  applicable","Refused","Done elsewhere"}   PATIENT COUNSELING:   Advised to take 1 mg of folate supplement per day if capable of pregnancy.   Sexuality: Discussed sexually transmitted diseases, partner selection, use of condoms, avoidance of unintended pregnancy  and contraceptive alternatives.   Advised to avoid cigarette smoking.  I discussed with the patient that most people either abstain from alcohol or drink within safe limits (<=14/week and <=4 drinks/occasion for males, <=7/weeks and <= 3 drinks/occasion for females) and that the risk for alcohol disorders and other health effects rises proportionally with the number of drinks per week and how often a drinker exceeds daily limits.  Discussed cessation/primary prevention of drug use and availability of treatment for abuse.   Diet: Encouraged to adjust caloric intake to maintain  or achieve ideal body weight, to reduce intake of dietary saturated fat and total fat, to limit sodium intake by avoiding high sodium foods and not adding table salt, and to maintain adequate dietary potassium and calcium preferably from fresh fruits, vegetables, and low-fat dairy products.    stressed the importance of regular exercise  Injury prevention: Discussed safety belts, safety helmets, smoke detector, smoking near bedding or upholstery.   Dental health: Discussed importance of regular tooth brushing, flossing, and dental visits.    NEXT PREVENTATIVE PHYSICAL DUE IN 1 YEAR. No follow-ups on file.

## 2023-10-29 NOTE — Assessment & Plan Note (Signed)
Chronic.  Controlled.  Last A1c was 6.4%.  Not currently on medication.  Up to date on eye exam.  Labs ordered today.  Return to clinic in 6 months for reevaluation.  Call sooner if concerns arise.

## 2023-10-29 NOTE — Assessment & Plan Note (Signed)
Chronic.  Controlled.  Continue with current medication regimen of Lisinopril 10mg .  Refills sent today. Labs ordered today.  Return to clinic in 6 months for reevaluation.  Call sooner if concerns arise.

## 2023-10-29 NOTE — Assessment & Plan Note (Signed)
Chronic.  Does not tolerated Rosuvastatin daily.

## 2023-10-29 NOTE — Assessment & Plan Note (Signed)
Labs ordered at visit today.  Will make recommendations based on lab results.   

## 2023-10-30 LAB — COMPREHENSIVE METABOLIC PANEL
ALT: 19 [IU]/L (ref 0–32)
AST: 15 [IU]/L (ref 0–40)
Albumin: 4.5 g/dL (ref 3.8–4.9)
Alkaline Phosphatase: 107 [IU]/L (ref 44–121)
BUN/Creatinine Ratio: 23 (ref 9–23)
BUN: 17 mg/dL (ref 6–24)
Bilirubin Total: <0.2 mg/dL (ref 0.0–1.2)
CO2: 21 mmol/L (ref 20–29)
Calcium: 9.5 mg/dL (ref 8.7–10.2)
Chloride: 106 mmol/L (ref 96–106)
Creatinine, Ser: 0.75 mg/dL (ref 0.57–1.00)
Globulin, Total: 2.2 g/dL (ref 1.5–4.5)
Glucose: 129 mg/dL — ABNORMAL HIGH (ref 70–99)
Potassium: 4.2 mmol/L (ref 3.5–5.2)
Sodium: 143 mmol/L (ref 134–144)
Total Protein: 6.7 g/dL (ref 6.0–8.5)
eGFR: 93 mL/min/{1.73_m2} (ref >59)

## 2023-10-30 LAB — HIV ANTIBODY (ROUTINE TESTING W REFLEX): HIV Screen 4th Generation wRfx: NONREACTIVE

## 2023-10-30 LAB — HEPATITIS C ANTIBODY: Hep C Virus Ab: NONREACTIVE

## 2023-10-30 LAB — HEMOGLOBIN A1C
Est. average glucose Bld gHb Est-mCnc: 134 mg/dL
Hgb A1c MFr Bld: 6.3 % — ABNORMAL HIGH (ref 4.8–5.6)

## 2023-10-30 LAB — LIPID PANEL
Chol/HDL Ratio: 4.1 {ratio} (ref 0.0–4.4)
Cholesterol, Total: 186 mg/dL (ref 100–199)
HDL: 45 mg/dL (ref >39)
LDL Chol Calc (NIH): 121 mg/dL — ABNORMAL HIGH (ref 0–99)
Triglycerides: 110 mg/dL (ref 0–149)
VLDL Cholesterol Cal: 20 mg/dL (ref 5–40)

## 2023-10-30 LAB — VITAMIN D 25 HYDROXY (VIT D DEFICIENCY, FRACTURES): Vit D, 25-Hydroxy: 33.1 ng/mL (ref 30.0–100.0)

## 2024-01-13 ENCOUNTER — Telehealth: Payer: Self-pay | Admitting: Nurse Practitioner

## 2024-01-13 DIAGNOSIS — Z23 Encounter for immunization: Secondary | ICD-10-CM

## 2024-01-13 NOTE — Telephone Encounter (Signed)
 Patient would like to have MMR titer drawn.  She has a new grandchild on the way.

## 2024-01-15 ENCOUNTER — Other Ambulatory Visit

## 2024-01-15 DIAGNOSIS — Z23 Encounter for immunization: Secondary | ICD-10-CM | POA: Diagnosis not present

## 2024-01-16 LAB — MEASLES/MUMPS/RUBELLA IMMUNITY
MUMPS ABS, IGG: 198 [AU]/ml (ref >10.9)
RUBEOLA AB, IGG: 217 [AU]/ml (ref >16.4)
Rubella Antibodies, IGG: 1.77 {index} (ref >0.99)

## 2024-01-18 ENCOUNTER — Encounter: Payer: Self-pay | Admitting: Nurse Practitioner

## 2024-04-07 LAB — HM DIABETES EYE EXAM

## 2024-04-25 DIAGNOSIS — H2511 Age-related nuclear cataract, right eye: Secondary | ICD-10-CM | POA: Diagnosis not present

## 2024-04-25 DIAGNOSIS — I10 Essential (primary) hypertension: Secondary | ICD-10-CM | POA: Diagnosis not present

## 2024-04-25 DIAGNOSIS — H2513 Age-related nuclear cataract, bilateral: Secondary | ICD-10-CM | POA: Diagnosis not present

## 2024-04-25 DIAGNOSIS — H40013 Open angle with borderline findings, low risk, bilateral: Secondary | ICD-10-CM | POA: Diagnosis not present

## 2024-04-28 ENCOUNTER — Ambulatory Visit: Payer: Self-pay | Admitting: Nurse Practitioner

## 2024-04-28 ENCOUNTER — Encounter: Payer: Self-pay | Admitting: Nurse Practitioner

## 2024-04-28 VITALS — BP 117/67 | HR 97 | Temp 98.7°F | Ht 60.0 in | Wt 141.0 lb

## 2024-04-28 DIAGNOSIS — Z0000 Encounter for general adult medical examination without abnormal findings: Secondary | ICD-10-CM | POA: Diagnosis not present

## 2024-04-28 DIAGNOSIS — E785 Hyperlipidemia, unspecified: Secondary | ICD-10-CM | POA: Diagnosis not present

## 2024-04-28 DIAGNOSIS — E1165 Type 2 diabetes mellitus with hyperglycemia: Secondary | ICD-10-CM | POA: Diagnosis not present

## 2024-04-28 DIAGNOSIS — G4700 Insomnia, unspecified: Secondary | ICD-10-CM

## 2024-04-28 DIAGNOSIS — I10 Essential (primary) hypertension: Secondary | ICD-10-CM

## 2024-04-28 DIAGNOSIS — E559 Vitamin D deficiency, unspecified: Secondary | ICD-10-CM | POA: Diagnosis not present

## 2024-04-28 DIAGNOSIS — Z789 Other specified health status: Secondary | ICD-10-CM

## 2024-04-28 MED ORDER — ZOLPIDEM TARTRATE 10 MG PO TABS: 10.0000 mg | ORAL_TABLET | Freq: Every day | ORAL | 5 refills | Status: DC

## 2024-04-28 MED ORDER — LISINOPRIL 10 MG PO TABS: ORAL_TABLET | ORAL | 1 refills | Status: DC

## 2024-04-28 NOTE — Assessment & Plan Note (Signed)
 Having cramping with Statin.  Has tried it a couple of times without success.

## 2024-04-28 NOTE — Assessment & Plan Note (Signed)
 Chronic.  Controlled.  Last A1c was 6.3%.  Not currently on medication.  Up to date on eye exam.  Labs ordered today.  Return to clinic in 6 months for reevaluation.  Call sooner if concerns arise.

## 2024-04-28 NOTE — Progress Notes (Signed)
 BP 117/67   Pulse 97   Temp 98.7 F (37.1 C) (Oral)   Ht 5' (1.524 m)   Wt 141 lb (64 kg)   SpO2 98%   BMI 27.54 kg/m    Subjective:    Patient ID: Monica Daniel, female    DOB: 10/09/1966, 58 y.o.   MRN: [id number]  HPI: Monica Daniel is a 58 y.o. female presenting on 04/28/2024 for comprehensive medical examination. Current medical complaints include:having cataracts removed  She currently lives with: Menopausal Symptoms: no  HYPERTENSION / HYPERLIPIDEMIA Satisfied with current treatment? yes Duration of hypertension: years BP monitoring frequency: not checking BP range:  BP medication side effects: no Past BP meds: Lisinopril  Duration of hyperlipidemia: years Cholesterol medication side effects: no Cholesterol supplements: none Past cholesterol medications: rosuvastatin  (crestor )- did not tolerate it well. Medication compliance: excellent compliance Aspirin: no Recent stressors: no Recurrent headaches: no Visual changes: no Palpitations: no Dyspnea: no Chest pain: no Lower extremity edema: no Dizzy/lightheaded: no   DIABETES Well controlled without medication. Has not been on medication for about 2 years. Hypoglycemic episodes:no Polydipsia/polyuria: no Visual disturbance: no Chest pain: no Paresthesias: no Glucose Monitoring: no             Accucheck frequency: Not Checking             Fasting glucose:             Post prandial:             Evening:             Before meals: Taking Insulin?: no             Long acting insulin:             Short acting insulin: Blood Pressure Monitoring: rarely Retinal Examination: Up to Date Foot Exam: Not up to Date Diabetic Education: Not Completed Pneumovax: Up to Date Influenza: Not up to Date Aspirin: no   [facility]   INSOMNIA Patient states she uses the Ambien  nightly.  She states it works well for her.  Denies concerns regarding medication today.  She doesn't use it every single  night.  Depression Screen done today and results listed below:     04/28/2024    1:18 PM 10/29/2023    2:44 PM 04/28/2023    9:49 AM 02/19/2023   11:11 AM 10/17/2021   11:38 AM  Depression screen PHQ 2/9  Decreased Interest 0 0 0 0 0  Down, Depressed, Hopeless 0 0 0 0 0  PHQ - 2 Score 0 0 0 0 0  Altered sleeping 3 3 3     Tired, decreased energy 0 0 0    Change in appetite 0 0 0    Feeling bad or failure about yourself  0 0 0    Trouble concentrating 0 0 0    Moving slowly or fidgety/restless 0 0 0    Suicidal thoughts 0 0 0    PHQ-9 Score 3 3 3     Difficult doing work/chores   Not difficult at all      The patient does not have a history of falls. I did complete a risk assessment for falls. A plan of care for falls was documented.   Past Medical History:  Past Medical History:  Diagnosis Date   Allergy    Arthritis    Cancer (HCC) 2011   basal cell on lip   Chickenpox    Glaucoma  no meds   Hyperlipidemia    Seizures (HCC)    as a child/ last one 39-20 years old   Torus palatinus     Surgical History:  Past Surgical History:  Procedure Laterality Date   ABDOMINAL HYSTERECTOMY  2002   per pt was vaginal hysterectomy   CARPAL TUNNEL RELEASE  1996   left   REDUCTION MAMMAPLASTY Bilateral 20+yrs ago   cut under armpit are/ not full reduction   TUBAL LIGATION  1991    Medications:  Current Outpatient Medications on File Prior to Visit  Medication Sig   Misc Natural Products (BEET ROOT PO) Take by mouth.   Multiple Vitamin (MULTIVITAMIN ADULT PO) Take by mouth daily.   Omega-3 Fatty Acids (FISH OIL PO) Take 500 mg by mouth daily.   vitamin B-12 (CYANOCOBALAMIN) 250 MCG tablet Take 250 mcg by mouth daily.   vitamin E 400 UNIT capsule Take 400 Units by mouth daily.   No current facility-administered medications on file prior to visit.    Allergies:  Allergies  Allergen Reactions   Milk (Cow) Nausea And Vomiting   Penicillin G Rash    Social History:   Social History   Socioeconomic History   Marital status: Married    Spouse name: Not on file   Number of children: Not on file   Years of education: Not on file   Highest education level: Not on file  Occupational History   Not on file  Tobacco Use   Smoking status: Every Day    Current packs/day: 0.50    Types: Cigarettes   Smokeless tobacco: Never  Substance and Sexual Activity   Alcohol use: Yes    Alcohol/week: 0.0 standard drinks of alcohol    Comment: rarely   Drug use: Not on file   Sexual activity: Not on file  Other Topics Concern   Not on file  Social History Narrative   Married.   2 children.   Works doing Adult nurse.   Enjoys going to [employer], fishing, gardening.   Social Drivers of Corporate investment banker Strain: Not on file  Food Insecurity: Not on file  Transportation Needs: Not on file  Physical Activity: Not on file  Stress: Not on file  Social Connections: Not on file  Intimate Partner Violence: Not on file   Social History   Tobacco Use  Smoking Status Every Day   Current packs/day: 0.50   Types: Cigarettes  Smokeless Tobacco Never   Social History   Substance and Sexual Activity  Alcohol Use Yes   Alcohol/week: 0.0 standard drinks of alcohol   Comment: rarely    Family History:  Family History  Problem Relation Age of Onset   Arthritis Father    Lung cancer Father    Hypertension Sister    Pancreatic cancer Mother    Diabetes Mother    Diabetes Maternal Aunt     Past medical history, surgical history, medications, allergies, family history and social history reviewed with patient today and changes made to appropriate areas of the chart.   Review of Systems  HENT:         Denies vision changes.  Eyes:  Negative for blurred vision and double vision.  Respiratory:  Negative for shortness of breath.   Cardiovascular:  Negative for chest pain, palpitations and leg swelling.  Neurological:  Negative for  dizziness, tingling and headaches.  Endo/Heme/Allergies:  Negative for polydipsia.       Denies  Polyuria  Psychiatric/Behavioral:  The patient has insomnia.    All other ROS negative except what is listed above and in the HPI.      Objective:    BP 117/67   Pulse 97   Temp 98.7 F (37.1 C) (Oral)   Ht 5' (1.524 m)   Wt 141 lb (64 kg)   SpO2 98%   BMI 27.54 kg/m   Wt Readings from Last 3 Encounters:  04/28/24 141 lb (64 kg)  10/29/23 142 lb 3.2 oz (64.5 kg)  04/28/23 141 lb 12.8 oz (64.3 kg)    Physical Exam Vitals and nursing note reviewed.  Constitutional:      General: She is awake. She is not in acute distress.    Appearance: Normal appearance. She is well-developed. She is not ill-appearing.  HENT:     Head: Normocephalic and atraumatic.     Right Ear: Hearing, tympanic membrane, ear canal and external ear normal. No drainage.     Left Ear: Hearing, tympanic membrane, ear canal and external ear normal. No drainage.     Nose: Nose normal.     Right Sinus: No maxillary sinus tenderness or frontal sinus tenderness.     Left Sinus: No maxillary sinus tenderness or frontal sinus tenderness.     Mouth/Throat:     Mouth: Mucous membranes are moist.     Pharynx: Oropharynx is clear. Uvula midline. No pharyngeal swelling, oropharyngeal exudate or posterior oropharyngeal erythema.   Eyes:     General: Lids are normal.        Right eye: No discharge.        Left eye: No discharge.     Extraocular Movements: Extraocular movements intact.     Conjunctiva/sclera: Conjunctivae normal.     Pupils: Pupils are equal, round, and reactive to light.     Visual Fields: Right eye visual fields normal and left eye visual fields normal.   Neck:     Thyroid : No thyromegaly.     Vascular: No carotid bruit.     Trachea: Trachea normal.   Cardiovascular:     Rate and Rhythm: Normal rate and regular rhythm.     Heart sounds: Normal heart sounds. No murmur heard.    No gallop.   Pulmonary:     Effort: Pulmonary effort is normal. No accessory muscle usage or respiratory distress.     Breath sounds: Normal breath sounds.  Chest:  Breasts:    Right: Normal.     Left: Normal.  Abdominal:     General: Bowel sounds are normal.     Palpations: Abdomen is soft. There is no hepatomegaly or splenomegaly.     Tenderness: There is no abdominal tenderness.   Musculoskeletal:        General: Normal range of motion.     Cervical back: Normal range of motion and neck supple.     Right lower leg: No edema.     Left lower leg: No edema.  Lymphadenopathy:     Head:     Right side of head: No submental, submandibular, tonsillar, preauricular or posterior auricular adenopathy.     Left side of head: No submental, submandibular, tonsillar, preauricular or posterior auricular adenopathy.     Cervical: No cervical adenopathy.     Upper Body:     Right upper body: No supraclavicular, axillary or pectoral adenopathy.     Left upper body: No supraclavicular, axillary or pectoral adenopathy.   Skin:    General: Skin  is warm and dry.     Capillary Refill: Capillary refill takes less than 2 seconds.     Findings: No rash.   Neurological:     Mental Status: She is alert and oriented to person, place, and time.     Gait: Gait is intact.   Psychiatric:        Attention and Perception: Attention normal.        Mood and Affect: Mood normal.        Speech: Speech normal.        Behavior: Behavior normal. Behavior is cooperative.        Thought Content: Thought content normal.        Judgment: Judgment normal.     Results for orders placed or performed in visit on 01/15/24  Measles/Mumps/Rubella Immunity   Collection Time: 01/15/24 11:52 AM  Result Value Ref Range   Rubella Antibodies, IGG 1.77 Immune >0.99 index   RUBEOLA AB, IGG 217.0 Immune >16.4 AU/mL   MUMPS ABS, IGG 198.0 Immune >10.9 AU/mL      Assessment & Plan:   Problem List Items Addressed This Visit        Cardiovascular and Mediastinum   Essential hypertension   Chronic.  Controlled.  Continue with current medication regimen of Lisinopril  10mg .  Refills sent today.  Labs ordered today.  Return to clinic in 6 months for reevaluation.  Call sooner if concerns arise.       Relevant Medications   lisinopril  (ZESTRIL ) 10 MG tablet     Endocrine   Type 2 diabetes mellitus with hyperglycemia (HCC)   Chronic.  Controlled.  Last A1c was 6.3%.  Not currently on medication.  Up to date on eye exam.  Labs ordered today.  Return to clinic in 6 months for reevaluation.  Call sooner if concerns arise.       Relevant Medications   lisinopril  (ZESTRIL ) 10 MG tablet   Other Relevant Orders   Hemoglobin A1c     Other   Vitamin D  deficiency   Labs ordered at visit today.  Will make recommendations based on lab results.        Relevant Orders   Vitamin D  (25 hydroxy)   Insomnia   Relevant Medications   zolpidem  (AMBIEN ) 10 MG tablet   Hyperlipidemia   Chronic.  Does not tolerated statins daily.  Labs ordered today.  Follow up in 6 months.  Call sooner if concerns arise.       Relevant Medications   lisinopril  (ZESTRIL ) 10 MG tablet   Other Relevant Orders   Lipid panel   Statin intolerance   Having cramping with Statin.  Has tried it a couple of times without success.       Other Visit Diagnoses       Annual physical exam    -  Primary   Health maintenance reviewed during visit. Labs ordered.  Vaccines reviewed. Colonoscopy due next year.   Relevant Orders   CBC with Differential/Platelet   Comprehensive metabolic panel with GFR   Lipid panel   TSH   Hemoglobin A1c        Follow up plan: Return in about 6 months (around 10/28/2024) for HTN, HLD, DM2 FU.   LABORATORY TESTING:  - Pap smear: not applicable  IMMUNIZATIONS:   - Tdap: Tetanus vaccination status reviewed: last tetanus booster within 10 years. - Influenza: Postponed to flu season - Pneumovax: Will get updated at  next visit. - Prevnar: Up to  date - COVID: Not applicable - HPV: Not applicable - Shingrix vaccine: Discussed at visit today  SCREENING: -Mammogram: Up to date  - Colonoscopy: Up to date  - Bone Density: Not applicable  -Hearing Test: Not applicable  -Spirometry: Not applicable   PATIENT COUNSELING:   Advised to take 1 mg of folate supplement per day if capable of pregnancy.   Sexuality: Discussed sexually transmitted diseases, partner selection, use of condoms, avoidance of unintended pregnancy  and contraceptive alternatives.   Advised to avoid cigarette smoking.  I discussed with the patient that most people either abstain from alcohol or drink within safe limits (<=14/week and <=4 drinks/occasion for males, <=7/weeks and <= 3 drinks/occasion for females) and that the risk for alcohol disorders and other health effects rises proportionally with the number of drinks per week and how often a drinker exceeds daily limits.  Discussed cessation/primary prevention of drug use and availability of treatment for abuse.   Diet: Encouraged to adjust caloric intake to maintain  or achieve ideal body weight, to reduce intake of dietary saturated fat and total fat, to limit sodium intake by avoiding high sodium foods and not adding table salt, and to maintain adequate dietary potassium and calcium  preferably from fresh fruits, vegetables, and low-fat dairy products.    stressed the importance of regular exercise  Injury prevention: Discussed safety belts, safety helmets, smoke detector, smoking near bedding or upholstery.   Dental health: Discussed importance of regular tooth brushing, flossing, and dental visits.    NEXT PREVENTATIVE PHYSICAL DUE IN 1 YEAR. Return in about 6 months (around 10/28/2024) for HTN, HLD, DM2 FU.

## 2024-04-28 NOTE — Assessment & Plan Note (Signed)
 Chronic.  Controlled.  Continue with current medication regimen of Lisinopril 10mg .  Refills sent today.  Labs ordered today.  Return to clinic in 6 months for reevaluation.  Call sooner if concerns arise.

## 2024-04-28 NOTE — Assessment & Plan Note (Signed)
 Chronic.  Does not tolerated statins daily.  Labs ordered today.  Follow up in 6 months.  Call sooner if concerns arise.

## 2024-04-28 NOTE — Assessment & Plan Note (Signed)
 Labs ordered at visit today.  Will make recommendations based on lab results.

## 2024-04-29 ENCOUNTER — Ambulatory Visit: Payer: Self-pay | Admitting: Nurse Practitioner

## 2024-04-29 LAB — COMPREHENSIVE METABOLIC PANEL WITH GFR
ALT: 16 IU/L (ref 0–32)
AST: 16 IU/L (ref 0–40)
Albumin: 4.5 g/dL (ref 3.8–4.9)
Alkaline Phosphatase: 117 IU/L (ref 44–121)
BUN/Creatinine Ratio: 26 — ABNORMAL HIGH (ref 9–23)
BUN: 19 mg/dL (ref 6–24)
Bilirubin Total: <0.2 mg/dL (ref 0.0–1.2)
CO2: 19 mmol/L — ABNORMAL LOW (ref 20–29)
Calcium: 9.3 mg/dL (ref 8.7–10.2)
Chloride: 105 mmol/L (ref 96–106)
Creatinine, Ser: 0.73 mg/dL (ref 0.57–1.00)
Globulin, Total: 2.2 g/dL (ref 1.5–4.5)
Glucose: 154 mg/dL — ABNORMAL HIGH (ref 70–99)
Potassium: 4.2 mmol/L (ref 3.5–5.2)
Sodium: 140 mmol/L (ref 134–144)
Total Protein: 6.7 g/dL (ref 6.0–8.5)
eGFR: 96 mL/min/{1.73_m2} (ref >59)

## 2024-04-29 LAB — CBC WITH DIFFERENTIAL/PLATELET
Basophils Absolute: 0.1 10*3/uL (ref 0.0–0.2)
Basos: 1 %
EOS (ABSOLUTE): 0.1 10*3/uL (ref 0.0–0.4)
Eos: 2 %
Hematocrit: 43.2 % (ref 34.0–46.6)
Hemoglobin: 14.5 g/dL (ref 11.1–15.9)
Immature Grans (Abs): 0 10*3/uL (ref 0.0–0.1)
Immature Granulocytes: 0 %
Lymphocytes Absolute: 2.8 10*3/uL (ref 0.7–3.1)
Lymphs: 35 %
MCH: 32.4 pg (ref 26.6–33.0)
MCHC: 33.6 g/dL (ref 31.5–35.7)
MCV: 96 fL (ref 79–97)
Monocytes Absolute: 0.5 10*3/uL (ref 0.1–0.9)
Monocytes: 6 %
Neutrophils Absolute: 4.4 10*3/uL (ref 1.4–7.0)
Neutrophils: 56 %
Platelets: 382 10*3/uL (ref 150–450)
RBC: 4.48 x10E6/uL (ref 3.77–5.28)
RDW: 12.1 % (ref 11.7–15.4)
WBC: 7.9 10*3/uL (ref 3.4–10.8)

## 2024-04-29 LAB — LIPID PANEL
Chol/HDL Ratio: 4.5 ratio — ABNORMAL HIGH (ref 0.0–4.4)
Cholesterol, Total: 175 mg/dL (ref 100–199)
HDL: 39 mg/dL — ABNORMAL LOW (ref >39)
LDL Chol Calc (NIH): 91 mg/dL (ref 0–99)
Triglycerides: 270 mg/dL — ABNORMAL HIGH (ref 0–149)
VLDL Cholesterol Cal: 45 mg/dL — ABNORMAL HIGH (ref 5–40)

## 2024-04-29 LAB — VITAMIN D 25 HYDROXY (VIT D DEFICIENCY, FRACTURES): Vit D, 25-Hydroxy: 30.7 ng/mL (ref 30.0–100.0)

## 2024-04-29 LAB — TSH: TSH: 0.676 u[IU]/mL (ref 0.450–4.500)

## 2024-04-29 LAB — HEMOGLOBIN A1C
Est. average glucose Bld gHb Est-mCnc: 134 mg/dL
Hgb A1c MFr Bld: 6.3 % — ABNORMAL HIGH (ref 4.8–5.6)

## 2024-05-26 DIAGNOSIS — H2511 Age-related nuclear cataract, right eye: Secondary | ICD-10-CM | POA: Diagnosis not present

## 2024-05-27 DIAGNOSIS — H25012 Cortical age-related cataract, left eye: Secondary | ICD-10-CM | POA: Diagnosis not present

## 2024-05-27 DIAGNOSIS — H25042 Posterior subcapsular polar age-related cataract, left eye: Secondary | ICD-10-CM | POA: Diagnosis not present

## 2024-05-27 DIAGNOSIS — H2512 Age-related nuclear cataract, left eye: Secondary | ICD-10-CM | POA: Diagnosis not present

## 2024-06-06 DIAGNOSIS — H2511 Age-related nuclear cataract, right eye: Secondary | ICD-10-CM | POA: Diagnosis not present

## 2024-06-09 DIAGNOSIS — H2512 Age-related nuclear cataract, left eye: Secondary | ICD-10-CM | POA: Diagnosis not present

## 2024-06-16 DIAGNOSIS — H2512 Age-related nuclear cataract, left eye: Secondary | ICD-10-CM | POA: Diagnosis not present

## 2024-08-09 NOTE — Progress Notes (Signed)
 Monica Daniel                                          MRN: [id number]   08/09/2024   The [facility] Quality Team Specialist reviewed this patient medical record for the purposes of chart review for care gap closure. The following were reviewed: chart review for care gap closure-kidney health evaluation for diabetes:eGFR  and uACR.    [facility] Quality Team

## 2024-08-16 ENCOUNTER — Encounter: Payer: Self-pay | Admitting: Nurse Practitioner

## 2024-08-16 ENCOUNTER — Ambulatory Visit: Admitting: Nurse Practitioner

## 2024-08-16 ENCOUNTER — Ambulatory Visit: Payer: Self-pay

## 2024-08-16 VITALS — BP 129/72 | HR 67 | Temp 97.8°F | Ht 60.0 in | Wt 144.8 lb

## 2024-08-16 DIAGNOSIS — J0110 Acute frontal sinusitis, unspecified: Secondary | ICD-10-CM

## 2024-08-16 MED ORDER — DOXYCYCLINE HYCLATE 100 MG PO TABS: 100.0000 mg | ORAL_TABLET | Freq: Two times a day (BID) | ORAL | 0 refills | Status: DC

## 2024-08-16 NOTE — Progress Notes (Signed)
 BP 129/72   Pulse 67   Temp 97.8 F (36.6 C) (Oral)   Ht 5' (1.524 m)   Wt 144 lb 12.8 oz (65.7 kg)   SpO2 98%   BMI 28.28 kg/m    Subjective:    Patient ID: Monica Daniel, female    DOB: [birth year], 58 y.o.   MRN: [id number]  HPI: Monica Daniel is a 58 y.o. female  Chief Complaint  Patient presents with   URI    Patient states she has been having sinus congestion, sinus pressure, and L ear pain. States the congestion and pressure started about 2 weeks ago, ear pain started yesterday.    UPPER RESPIRATORY TRACT INFECTION Worst symptom: Fever: no Cough: no Shortness of breath: no Wheezing: no Chest pain: no Chest tightness: no Chest congestion: no Nasal congestion: yes Runny nose: a little Post nasal drip: no Sneezing: no Sore throat: no Swollen glands: no Sinus pressure: yes Headache: yes Face pain: yes Toothache: no Ear pain: yes left Ear pressure: no bilateral Eyes red/itching:no Eye drainage/crusting: no  Vomiting: no Rash: no Fatigue: no Sick contacts: no Strep contacts: no  Context: worse Recurrent sinusitis: no Relief with OTC cold/cough medications: yes Treatments attempted: tylenol    Relevant past medical, surgical, family and social history reviewed and updated as indicated. Interim medical history since our last visit reviewed. Allergies and medications reviewed and updated.  Review of Systems  Constitutional:  Negative for fatigue and fever.  HENT:  Positive for congestion, ear pain, postnasal drip, sinus pressure, sinus pain and sore throat. Negative for dental problem, rhinorrhea and sneezing.   Respiratory:  Negative for cough, shortness of breath and wheezing.   Cardiovascular:  Negative for chest pain.  Gastrointestinal:  Negative for vomiting.  Skin:  Negative for rash.  Neurological:  Positive for headaches.    Per HPI unless specifically indicated above     Objective:    BP 129/72   Pulse 67   Temp 97.8 F (36.6  C) (Oral)   Ht 5' (1.524 m)   Wt 144 lb 12.8 oz (65.7 kg)   SpO2 98%   BMI 28.28 kg/m   Wt Readings from Last 3 Encounters:  08/16/24 144 lb 12.8 oz (65.7 kg)  04/28/24 141 lb (64 kg)  10/29/23 142 lb 3.2 oz (64.5 kg)    Physical Exam Vitals and nursing note reviewed.  Constitutional:      General: She is not in acute distress.    Appearance: Normal appearance. She is normal weight. She is not ill-appearing, toxic-appearing or diaphoretic.  HENT:     Head: Normocephalic.     Right Ear: External ear normal. A middle ear effusion is present.     Left Ear: External ear normal. A middle ear effusion is present.     Nose: Congestion and rhinorrhea present.     Right Sinus: Frontal sinus tenderness present. No maxillary sinus tenderness.     Left Sinus: Frontal sinus tenderness present. No maxillary sinus tenderness.     Mouth/Throat:     Mouth: Mucous membranes are moist.     Pharynx: Oropharynx is clear. Posterior oropharyngeal erythema present. No oropharyngeal exudate.  Eyes:     General:        Right eye: No discharge.        Left eye: No discharge.     Extraocular Movements: Extraocular movements intact.     Conjunctiva/sclera: Conjunctivae normal.     Pupils: Pupils are equal,  round, and reactive to light.  Cardiovascular:     Rate and Rhythm: Normal rate and regular rhythm.     Heart sounds: No murmur heard. Pulmonary:     Effort: Pulmonary effort is normal. No respiratory distress.     Breath sounds: Normal breath sounds. No wheezing or rales.  Musculoskeletal:     Cervical back: Normal range of motion and neck supple.  Skin:    General: Skin is warm and dry.     Capillary Refill: Capillary refill takes less than 2 seconds.  Neurological:     General: No focal deficit present.     Mental Status: She is alert and oriented to person, place, and time. Mental status is at baseline.  Psychiatric:        Mood and Affect: Mood normal.        Behavior: Behavior normal.         Thought Content: Thought content normal.        Judgment: Judgment normal.     Results for orders placed or performed in visit on 05/02/24  [facility] DIABETES EYE EXAM   Collection Time: 04/07/24  8:29 AM  Result Value Ref Range   [facility] Diabetic Eye Exam        Assessment & Plan:   Problem List Items Addressed This Visit   None Visit Diagnoses       Acute non-recurrent frontal sinusitis    -  Primary   Will treat with doxycyline.  Complete course of medication.  Follow up if not improved.   Relevant Medications   doxycycline (VIBRA-TABS) 100 MG tablet        Follow up plan: No follow-ups on file.

## 2024-08-16 NOTE — Telephone Encounter (Signed)
 FYI Only or Action Required?: FYI only for provider.  Patient was last seen in primary care on 04/28/2024 by Melvin Pao, NP.  Called Nurse Triage reporting Ear Pain.  Symptoms began yesterday.  Interventions attempted: Nothing.  Symptoms are: gradually worsening.  Triage Disposition: See Physician Within 24 Hours  Patient/caregiver understands and will follow disposition?: Yes    Sinus infection, congestion, feeling puffy, ears hurting   Reason for Disposition  White, yellow, or green discharge (pus)  Answer Assessment - Initial Assessment Questions 1. LOCATION: Which ear is involved?     Left ear 2. ONSET: When did the ear pain start?      Yesterday 3. SEVERITY: How bad is the pain?  (Scale 1-10; mild, moderate or severe)     5/10 4. URI SYMPTOMS: Do you have a runny nose or cough?     Stuffy nose & congestion 5. FEVER: Do you have a fever? If Yes, ask: What is your temperature, how was it measured, and when did it start?     no 6. CAUSE: Have you been swimming recently?, How often do you use Q-TIPS?, Have you had any recent air travel or scuba diving?     na 7. OTHER SYMPTOMS: Do you have any other symptoms? (e.g., decreased hearing, dizziness, headache, stiff neck, vomiting)     Puffy face/nose - nasal passage inflamed - green mucous 8. PREGNANCY: Is there any chance you are pregnant? When was your last menstrual period?     No Pt states she suffers from sinus issues all the time  Protocols used: Earache-A-AH

## 2024-10-27 ENCOUNTER — Encounter: Payer: Self-pay | Admitting: Nurse Practitioner

## 2024-10-27 ENCOUNTER — Ambulatory Visit: Admitting: Nurse Practitioner

## 2024-10-27 VITALS — BP 133/85 | HR 70 | Temp 98.0°F | Ht 60.0 in | Wt 144.6 lb

## 2024-10-27 DIAGNOSIS — G4700 Insomnia, unspecified: Secondary | ICD-10-CM | POA: Diagnosis not present

## 2024-10-27 DIAGNOSIS — I10 Essential (primary) hypertension: Secondary | ICD-10-CM

## 2024-10-27 DIAGNOSIS — E1165 Type 2 diabetes mellitus with hyperglycemia: Secondary | ICD-10-CM

## 2024-10-27 DIAGNOSIS — E785 Hyperlipidemia, unspecified: Secondary | ICD-10-CM | POA: Diagnosis not present

## 2024-10-27 MED ORDER — LISINOPRIL 10 MG PO TABS: ORAL_TABLET | ORAL | 1 refills | Status: AC

## 2024-10-27 MED ORDER — ZOLPIDEM TARTRATE 10 MG PO TABS: 10.0000 mg | ORAL_TABLET | Freq: Every day | ORAL | 5 refills | Status: AC

## 2024-10-27 NOTE — Progress Notes (Signed)
 BP 133/85 (BP Location: Right Arm, Cuff Size: Normal)   Pulse 70   Temp 98 F (36.7 C) (Oral)   Ht 5' (1.524 m)   Wt 144 lb 9.6 oz (65.6 kg)   SpO2 99%   BMI 28.24 kg/m    Subjective:    Patient ID: Monica Daniel, female    DOB: [birth year], 58 y.o.   MRN: [id number]  HPI: Monica Daniel is a 58 y.o. female  Chief Complaint  Patient presents with   office visit    6 month F/u   HYPERTENSION / HYPERLIPIDEMIA Satisfied with current treatment? yes Duration of hypertension: years BP monitoring frequency: randomly BP range: 120/80 BP medication side effects: no Past BP meds: Lisinopril  Duration of hyperlipidemia: years Cholesterol medication side effects: no Cholesterol supplements: none Past cholesterol medications: rosuvastatin  (crestor )- did not tolerate it well. Medication compliance: excellent compliance Aspirin: no Recent stressors: no Recurrent headaches: no Visual changes: no Palpitations: no Dyspnea: no Chest pain: no Lower extremity edema: no Dizzy/lightheaded: no   DIABETES Well controlled without medication. Has not been on medication for about 2 years.  Last A1c 6.3%.  Hypoglycemic episodes:no Polydipsia/polyuria: no Visual disturbance: no Chest pain: no Paresthesias: no Glucose Monitoring: no             Accucheck frequency: Not Checking             Fasting glucose:             Post prandial:             Evening:             Before meals: Taking Insulin?: no             Long acting insulin:             Short acting insulin: Blood Pressure Monitoring: rarely Retinal Examination: Up to Date Foot Exam: Not up to Date Diabetic Education: Not Completed Pneumovax: Up to Date Influenza: Not up to Date Aspirin: no   [facility]- need to request eye exam   INSOMNIA Patient states she uses the Ambien  nightly.  She states it works well for her.  Denies concerns regarding medication today.  She doesn't use it every single night.   Feels like it is helping with her sleep.     Relevant past medical, surgical, family and social history reviewed and updated as indicated. Interim medical history since our last visit reviewed. Allergies and medications reviewed and updated.  Review of Systems  Eyes:  Negative for visual disturbance.  Respiratory:  Negative for chest tightness and shortness of breath.   Cardiovascular:  Negative for chest pain, palpitations and leg swelling.  Endocrine: Negative for polydipsia and polyuria.  Neurological:  Negative for dizziness, light-headedness, numbness and headaches.  Psychiatric/Behavioral:  Positive for sleep disturbance.     Per HPI unless specifically indicated above     Objective:    BP 133/85 (BP Location: Right Arm, Cuff Size: Normal)   Pulse 70   Temp 98 F (36.7 C) (Oral)   Ht 5' (1.524 m)   Wt 144 lb 9.6 oz (65.6 kg)   SpO2 99%   BMI 28.24 kg/m   Wt Readings from Last 3 Encounters:  10/27/24 144 lb 9.6 oz (65.6 kg)  08/16/24 144 lb 12.8 oz (65.7 kg)  04/28/24 141 lb (64 kg)    Physical Exam Vitals and nursing note reviewed.  Constitutional:      General: She  is not in acute distress.    Appearance: Normal appearance. She is normal weight. She is not ill-appearing, toxic-appearing or diaphoretic.  HENT:     Head: Normocephalic.     Right Ear: External ear normal.     Left Ear: External ear normal.     Nose: Nose normal.     Mouth/Throat:     Mouth: Mucous membranes are moist.     Pharynx: Oropharynx is clear.  Eyes:     General:        Right eye: No discharge.        Left eye: No discharge.     Extraocular Movements: Extraocular movements intact.     Conjunctiva/sclera: Conjunctivae normal.     Pupils: Pupils are equal, round, and reactive to light.  Cardiovascular:     Rate and Rhythm: Normal rate and regular rhythm.     Heart sounds: No murmur heard. Pulmonary:     Effort: Pulmonary effort is normal. No respiratory distress.     Breath sounds:  Normal breath sounds. No wheezing or rales.  Musculoskeletal:     Cervical back: Normal range of motion and neck supple.  Skin:    General: Skin is warm and dry.     Capillary Refill: Capillary refill takes less than 2 seconds.  Neurological:     General: No focal deficit present.     Mental Status: She is alert and oriented to person, place, and time. Mental status is at baseline.  Psychiatric:        Mood and Affect: Mood normal.        Behavior: Behavior normal.        Thought Content: Thought content normal.        Judgment: Judgment normal.     Results for orders placed or performed in visit on 05/02/24  [facility] DIABETES EYE EXAM   Collection Time: 04/07/24  8:29 AM  Result Value Ref Range   [facility] Diabetic Eye Exam        Assessment & Plan:   Problem List Items Addressed This Visit       Cardiovascular and Mediastinum   Essential hypertension   Chronic.  Controlled.  Continue with current medication regimen of Lisinopril  10mg .  Recommend checking blood pressure at home and bring log to next visit.  Refills sent today.  Labs ordered today.  Return to clinic in 6 months for reevaluation.  Call sooner if concerns arise.       Relevant Medications   lisinopril  (ZESTRIL ) 10 MG tablet     Endocrine   Type 2 diabetes mellitus with hyperglycemia (HCC) - Primary   Chronic.  Controlled.  Last A1c was 6.3%.  Not currently on medication.  Continue with low carb diet and exercise.  Up to date on eye exam.  Labs ordered today.  Return to clinic in 6 months for reevaluation.  Call sooner if concerns arise.       Relevant Medications   lisinopril  (ZESTRIL ) 10 MG tablet   Other Relevant Orders   Comprehensive metabolic panel with GFR   Hemoglobin A1c     Other   Insomnia   Chronic.  Controlled.  Continue with current medication regimen of Ambien  nightly.  Aware of risks of taking medication and okay with continuing.  PDMP checked.  Refills sent today.  Labs ordered today.  Return to  clinic in 6 months for reevaluation.  Call sooner if concerns arise.       Relevant Medications  zolpidem  (AMBIEN ) 10 MG tablet   Hyperlipidemia   Chronic.  Does not tolerated statins daily.  Declined labs at visit due to financial reasons.  Follow up in 6 months.  Call sooner if concerns arise.       Relevant Medications   lisinopril  (ZESTRIL ) 10 MG tablet     Follow up plan: Return in about 6 months (around 04/27/2025) for Physical and Fasting labs.

## 2024-10-27 NOTE — Assessment & Plan Note (Signed)
 Chronic.  Controlled.  Last A1c was 6.3%.  Not currently on medication.  Continue with low carb diet and exercise.  Up to date on eye exam.  Labs ordered today.  Return to clinic in 6 months for reevaluation.  Call sooner if concerns arise.

## 2024-10-27 NOTE — Assessment & Plan Note (Signed)
 Chronic.  Controlled.  Continue with current medication regimen of Ambien  nightly.  Aware of risks of taking medication and okay with continuing.  PDMP checked.  Refills sent today.  Labs ordered today.  Return to clinic in 6 months for reevaluation.  Call sooner if concerns arise.

## 2024-10-27 NOTE — Assessment & Plan Note (Signed)
 Chronic.  Controlled.  Continue with current medication regimen of Lisinopril  10mg .  Recommend checking blood pressure at home and bring log to next visit.  Refills sent today.  Labs ordered today.  Return to clinic in 6 months for reevaluation.  Call sooner if concerns arise.

## 2024-10-27 NOTE — Assessment & Plan Note (Addendum)
 Chronic.  Does not tolerated statins daily.  Declined labs at visit due to financial reasons.  Follow up in 6 months.  Call sooner if concerns arise.

## 2024-10-29 LAB — COMPREHENSIVE METABOLIC PANEL WITH GFR
ALT: 21 IU/L (ref 0–32)
AST: 20 IU/L (ref 0–40)
Albumin: 4.5 g/dL (ref 3.8–4.9)
Alkaline Phosphatase: 91 IU/L (ref 49–135)
BUN/Creatinine Ratio: 34 — ABNORMAL HIGH (ref 9–23)
BUN: 21 mg/dL (ref 6–24)
Bilirubin Total: <0.2 mg/dL (ref 0.0–1.2)
CO2: 21 mmol/L (ref 20–29)
Calcium: 9.7 mg/dL (ref 8.7–10.2)
Chloride: 104 mmol/L (ref 96–106)
Creatinine, Ser: 0.61 mg/dL (ref 0.57–1.00)
Globulin, Total: 2 g/dL (ref 1.5–4.5)
Glucose: 98 mg/dL (ref 70–99)
Potassium: 4.4 mmol/L (ref 3.5–5.2)
Sodium: 141 mmol/L (ref 134–144)
Total Protein: 6.5 g/dL (ref 6.0–8.5)
eGFR: 104 mL/min/1.73

## 2024-10-29 LAB — HEMOGLOBIN A1C
Est. average glucose Bld gHb Est-mCnc: 137 mg/dL
Hgb A1c MFr Bld: 6.4 % — AB (ref 4.8–5.6)

## 2024-10-31 ENCOUNTER — Ambulatory Visit: Payer: Self-pay | Admitting: Nurse Practitioner

## 2025-04-28 ENCOUNTER — Ambulatory Visit: Admitting: Nurse Practitioner
